# Patient Record
Sex: Male | Born: 1980 | Race: White | Hispanic: No | Marital: Married | Smoking: Light tobacco smoker
Health system: Southern US, Community
[De-identification: ages and names within clinical notes are randomized; demographics above are authoritative.]

## PROBLEM LIST (undated history)

## (undated) DIAGNOSIS — N201 Calculus of ureter: Secondary | ICD-10-CM

## (undated) HISTORY — PX: TONSILLECTOMY: SUR1361

---

## 2013-07-05 ENCOUNTER — Encounter (HOSPITAL_COMMUNITY): Payer: Self-pay | Admitting: Emergency Medicine

## 2013-07-05 ENCOUNTER — Emergency Department (HOSPITAL_COMMUNITY)
Admission: EM | Admit: 2013-07-05 | Discharge: 2013-07-05 | Disposition: A | Discharge status: Home / Self Care | Payer: BC Managed Care – PPO | Attending: Emergency Medicine | Admitting: Emergency Medicine

## 2013-07-05 ENCOUNTER — Emergency Department (HOSPITAL_COMMUNITY): Payer: BC Managed Care – PPO

## 2013-07-05 DIAGNOSIS — F17200 Nicotine dependence, unspecified, uncomplicated: Secondary | ICD-10-CM | POA: Diagnosis not present

## 2013-07-05 DIAGNOSIS — N201 Calculus of ureter: Secondary | ICD-10-CM

## 2013-07-05 DIAGNOSIS — R109 Unspecified abdominal pain: Secondary | ICD-10-CM | POA: Diagnosis present

## 2013-07-05 DIAGNOSIS — Z79899 Other long term (current) drug therapy: Secondary | ICD-10-CM | POA: Diagnosis not present

## 2013-07-05 DIAGNOSIS — R110 Nausea: Secondary | ICD-10-CM | POA: Insufficient documentation

## 2013-07-05 LAB — CBC WITH DIFFERENTIAL/PLATELET
Basophils Absolute: 0 10*3/uL (ref 0.0–0.1)
Basophils Relative: 1 % (ref 0–1)
EOS PCT: 2 % (ref 0–5)
Eosinophils Absolute: 0.1 10*3/uL (ref 0.0–0.7)
HEMATOCRIT: 43.7 % (ref 39.0–52.0)
HEMOGLOBIN: 14.8 g/dL (ref 13.0–17.0)
LYMPHS PCT: 19 % (ref 12–46)
Lymphs Abs: 1.4 10*3/uL (ref 0.7–4.0)
MCH: 30.9 pg (ref 26.0–34.0)
MCHC: 33.9 g/dL (ref 30.0–36.0)
MCV: 91.2 fL (ref 78.0–100.0)
MONO ABS: 0.6 10*3/uL (ref 0.1–1.0)
MONOS PCT: 8 % (ref 3–12)
NEUTROS ABS: 5.4 10*3/uL (ref 1.7–7.7)
Neutrophils Relative %: 70 % (ref 43–77)
Platelets: 247 10*3/uL (ref 150–400)
RBC: 4.79 MIL/uL (ref 4.22–5.81)
RDW: 12.2 % (ref 11.5–15.5)
WBC: 7.6 10*3/uL (ref 4.0–10.5)

## 2013-07-05 LAB — I-STAT CHEM 8, ED
BUN: 14 mg/dL (ref 6–23)
CALCIUM ION: 1.25 mmol/L — AB (ref 1.12–1.23)
CHLORIDE: 102 meq/L (ref 96–112)
Creatinine, Ser: 1 mg/dL (ref 0.50–1.35)
GLUCOSE: 90 mg/dL (ref 70–99)
HCT: 47 % (ref 39.0–52.0)
Hemoglobin: 16 g/dL (ref 13.0–17.0)
Potassium: 4.2 mEq/L (ref 3.7–5.3)
Sodium: 142 mEq/L (ref 137–147)
TCO2: 26 mmol/L (ref 0–100)

## 2013-07-05 LAB — URINALYSIS, ROUTINE W REFLEX MICROSCOPIC
BILIRUBIN URINE: NEGATIVE
GLUCOSE, UA: NEGATIVE mg/dL
KETONES UR: NEGATIVE mg/dL
Leukocytes, UA: NEGATIVE
Nitrite: NEGATIVE
PH: 6.5 (ref 5.0–8.0)
Protein, ur: NEGATIVE mg/dL
Specific Gravity, Urine: 1.013 (ref 1.005–1.030)
Urobilinogen, UA: 0.2 mg/dL (ref 0.0–1.0)

## 2013-07-05 LAB — URINE MICROSCOPIC-ADD ON

## 2013-07-05 MED ORDER — OXYCODONE-ACETAMINOPHEN 5-325 MG PO TABS: 1.0000 | ORAL_TABLET | Freq: Four times a day (QID) | ORAL | Status: DC | PRN

## 2013-07-05 MED ORDER — OXYCODONE-ACETAMINOPHEN 5-325 MG PO TABS
1.0000 | ORAL_TABLET | Freq: Once | ORAL | Status: AC
  Administered 2013-07-05: 1 via ORAL
  Filled 2013-07-05: qty 1

## 2013-07-05 MED ORDER — TAMSULOSIN HCL 0.4 MG PO CAPS: 0.4000 mg | ORAL_CAPSULE | Freq: Every day | ORAL | Status: DC

## 2013-07-05 MED ORDER — ONDANSETRON 8 MG PO TBDP: 8.0000 mg | ORAL_TABLET | Freq: Three times a day (TID) | ORAL | Status: DC | PRN

## 2013-07-05 MED ORDER — HYDROMORPHONE HCL PF 1 MG/ML IJ SOLN
1.0000 mg | Freq: Once | INTRAMUSCULAR | Status: AC
  Administered 2013-07-05: 1 mg via INTRAVENOUS
  Filled 2013-07-05: qty 1

## 2013-07-05 MED ORDER — KETOROLAC TROMETHAMINE 30 MG/ML IJ SOLN
30.0000 mg | Freq: Once | INTRAMUSCULAR | Status: AC
  Administered 2013-07-05: 30 mg via INTRAVENOUS
  Filled 2013-07-05: qty 1

## 2013-07-05 MED ORDER — ONDANSETRON HCL 4 MG/2ML IJ SOLN
4.0000 mg | Freq: Once | INTRAMUSCULAR | Status: AC
  Administered 2013-07-05: 4 mg via INTRAVENOUS
  Filled 2013-07-05: qty 2

## 2013-07-05 NOTE — ED Notes (Signed)
Per pt, sudden onset left flank pain at 8:30 am.  No hx of stones.  Pain radiates to groin.  No change in urination.

## 2013-07-05 NOTE — Discharge Instructions (Signed)

## 2013-07-05 NOTE — ED Provider Notes (Signed)
CSN: [id number]     Arrival date & time 07/05/13  0941 History   First MD Initiated Contact with Patient 07/05/13 1008     Chief Complaint  Patient presents with  . Flank Pain     (Consider location/radiation/quality/duration/timing/severity/associated sxs/prior Treatment) Patient is a 33 y.o. male presenting with flank pain. The history is provided by the patient.  Flank Pain This is a new problem. Pertinent negatives include no chest pain, no abdominal pain, no headaches and no shortness of breath.   patient developed left flank pain this morning. It starts in his left back that goes down into his testicle somewhat. No dysuria. No fevers. The pain is crampy. No additions help it or hurt it. He's had some mild nausea without vomiting. No previous history kidney stones. Patient is severe.  History reviewed. No pertinent past medical history. History reviewed. No pertinent past surgical history. History reviewed. No pertinent family history. History  Substance Use Topics  . Smoking status: Current Some Day Smoker  . Smokeless tobacco: Not on file  . Alcohol Use: Yes     Comment: daily    Review of Systems  Constitutional: Negative for activity change and appetite change.  Eyes: Negative for pain.  Respiratory: Negative for chest tightness and shortness of breath.   Cardiovascular: Negative for chest pain and leg swelling.  Gastrointestinal: Positive for nausea. Negative for vomiting, abdominal pain and diarrhea.  Genitourinary: Positive for flank pain. Negative for hematuria.  Musculoskeletal: Negative for back pain and neck stiffness.  Skin: Negative for rash.  Neurological: Negative for weakness, numbness and headaches.  Psychiatric/Behavioral: Negative for behavioral problems.      Allergies  Review of patient's allergies indicates no known allergies.  Home Medications   Prior to Admission medications   Medication Sig Start Date End Date Taking? Authorizing Provider   ibuprofen (ADVIL,MOTRIN) 200 MG tablet Take 400 mg by mouth every 6 (six) hours as needed for moderate pain.   Yes Historical Provider, MD  ondansetron (ZOFRAN-ODT) 8 MG disintegrating tablet Take 1 tablet (8 mg total) by mouth every 8 (eight) hours as needed for nausea or vomiting. 07/05/13   Juliet RudeNathan R. Pickering, MD  oxyCODONE-acetaminophen (PERCOCET/ROXICET) 5-325 MG per tablet Take 1-2 tablets by mouth every 6 (six) hours as needed for severe pain. 07/05/13   Juliet RudeNathan R. Pickering, MD  tamsulosin (FLOMAX) 0.4 MG CAPS capsule Take 1 capsule (0.4 mg total) by mouth daily. 07/05/13   Juliet RudeNathan R. Pickering, MD   BP 124/83  Pulse 51  Temp(Src) 97.8 F (36.6 C) (Oral)  Resp 16  SpO2 99% Physical Exam  Nursing note and vitals reviewed. Constitutional: He is oriented to person, place, and time. He appears well-developed and well-nourished.  Patient appears uncomfortable  HENT:  Head: Normocephalic and atraumatic.  Cardiovascular: Normal rate, regular rhythm and normal heart sounds.   No murmur heard. Pulmonary/Chest: Effort normal and breath sounds normal.  Abdominal: Soft. Bowel sounds are normal. He exhibits no distension and no mass. There is no tenderness. There is no rebound and no guarding.  Genitourinary:  CVA tenderness on left. No testicular tenderness mass or hernia  Musculoskeletal: Normal range of motion. He exhibits no edema.  Neurological: He is alert and oriented to person, place, and time. No cranial nerve deficit.  Skin: Skin is warm and dry.    ED Course  Procedures (including critical care time) Labs Review Labs Reviewed  URINALYSIS, ROUTINE W REFLEX MICROSCOPIC - Abnormal; Notable for the following:  Hgb urine dipstick LARGE (*)    All other components within normal limits  URINE MICROSCOPIC-ADD ON - Abnormal; Notable for the following:    Bacteria, UA FEW (*)    All other components within normal limits  I-STAT CHEM 8, ED - Abnormal; Notable for the following:     Calcium, Ion 1.25 (*)    All other components within normal limits  CBC WITH DIFFERENTIAL    Imaging Review Ct Abdomen Pelvis Wo Contrast  07/05/2013   CLINICAL DATA:  Left flank pain  EXAM: CT ABDOMEN AND PELVIS WITHOUT CONTRAST  TECHNIQUE: Multidetector CT imaging of the abdomen and pelvis was performed following the standard protocol without IV contrast.  COMPARISON:  None.  FINDINGS: Lung bases are unremarkable. Small amount of fluid noted in distal esophagus probable from gastroesophageal reflux. Clinical correlation is necessary. Unenhanced liver shows no biliary ductal dilatation. No calcified gallstones are noted within gallbladder. Unenhanced pancreas, spleen and adrenal glands are unremarkable. There is mild left hydronephrosis. Minimal left hydroureter. There is mild left periureteral stranding. No renal calcifications are identified.  In axial image 79 there is 4 mm calcified obstructive calculus in distal left ureter about 2.5 cm from left UVJ. No calcified calculi are noted within urinary bladder. Prostate gland and seminal vesicles are unremarkable.  No small bowel obstruction. No pericecal inflammation. Normal appendix. No ascites or free air. No adenopathy. No inguinal adenopathy. No destructive bony lesions are noted within pelvis.  Sagittal images of the spine are unremarkable. No destructive bony lesions are noted.  IMPRESSION: 1. There is mild left hydronephrosis and minimal left hydroureter. Mild left periureteral stranding. 2. There is 4 mm calcified obstructive calculus in distal left ureter about 2.5 cm from left UVJ. 3. Normal appendix.  No pericecal inflammation. 4. No calcified calculi are noted within urinary bladder. 5. No small bowel obstruction.   Electronically Signed   By: Natasha MeadLiviu  Pop M.D.   On: 07/05/2013 11:16     EKG Interpretation None      MDM   Final diagnoses:  Left ureteral stone    Patient with flank pain. He has a ureteral stone. Pain somewhat improved.  No UTI. Will discharge home with antibiotics Flomax and urology followup    Juliet Rudeathan R. Rubin PayorPickering, MD 07/05/13 1601

## 2013-07-05 NOTE — ED Notes (Signed)
Initial Contact - pt c/o 10/10 pain to L flank, denies hx of similar.  Pt denies nausea or other complaints at this time.  Pt appears uncomfortable.  Dr. Rubin PayorPickering aware.  Skin PWD.  A+Ox4.  MAEI, self repositioning for comfort.  NAD.

## 2013-07-07 ENCOUNTER — Ambulatory Visit (HOSPITAL_BASED_OUTPATIENT_CLINIC_OR_DEPARTMENT_OTHER): Admit: 2013-07-07 | Payer: Self-pay | Admitting: Urology

## 2013-07-07 ENCOUNTER — Encounter (HOSPITAL_BASED_OUTPATIENT_CLINIC_OR_DEPARTMENT_OTHER)
Admission: EM | Disposition: A | Discharge status: Home / Self Care | Payer: Self-pay | Source: Ambulatory Visit | Attending: Urology

## 2013-07-07 ENCOUNTER — Encounter (HOSPITAL_BASED_OUTPATIENT_CLINIC_OR_DEPARTMENT_OTHER): Payer: Self-pay | Admitting: *Deleted

## 2013-07-07 ENCOUNTER — Encounter (HOSPITAL_BASED_OUTPATIENT_CLINIC_OR_DEPARTMENT_OTHER): Payer: BC Managed Care – PPO | Admitting: Anesthesiology

## 2013-07-07 ENCOUNTER — Ambulatory Visit (HOSPITAL_BASED_OUTPATIENT_CLINIC_OR_DEPARTMENT_OTHER): Payer: BC Managed Care – PPO | Admitting: Anesthesiology

## 2013-07-07 ENCOUNTER — Ambulatory Visit (HOSPITAL_BASED_OUTPATIENT_CLINIC_OR_DEPARTMENT_OTHER)
Admission: EM | Admit: 2013-07-07 | Discharge: 2013-07-07 | Disposition: A | Discharge status: Home / Self Care | Payer: BC Managed Care – PPO | Source: Ambulatory Visit | Attending: Urology | Admitting: Urology

## 2013-07-07 ENCOUNTER — Encounter (HOSPITAL_COMMUNITY): Payer: Self-pay | Admitting: Emergency Medicine

## 2013-07-07 ENCOUNTER — Emergency Department (HOSPITAL_COMMUNITY)
Admission: EM | Admit: 2013-07-07 | Discharge: 2013-07-07 | Disposition: A | Discharge status: Home / Self Care | Payer: BC Managed Care – PPO | Source: Home / Self Care | Attending: Emergency Medicine | Admitting: Emergency Medicine

## 2013-07-07 ENCOUNTER — Other Ambulatory Visit: Payer: Self-pay | Admitting: Urology

## 2013-07-07 ENCOUNTER — Emergency Department (HOSPITAL_COMMUNITY): Payer: BC Managed Care – PPO

## 2013-07-07 DIAGNOSIS — F17200 Nicotine dependence, unspecified, uncomplicated: Secondary | ICD-10-CM | POA: Insufficient documentation

## 2013-07-07 DIAGNOSIS — Z79899 Other long term (current) drug therapy: Secondary | ICD-10-CM | POA: Insufficient documentation

## 2013-07-07 DIAGNOSIS — Z792 Long term (current) use of antibiotics: Secondary | ICD-10-CM

## 2013-07-07 DIAGNOSIS — Z9889 Other specified postprocedural states: Secondary | ICD-10-CM

## 2013-07-07 DIAGNOSIS — N201 Calculus of ureter: Secondary | ICD-10-CM | POA: Insufficient documentation

## 2013-07-07 DIAGNOSIS — Z87442 Personal history of urinary calculi: Secondary | ICD-10-CM

## 2013-07-07 DIAGNOSIS — N23 Unspecified renal colic: Secondary | ICD-10-CM

## 2013-07-07 HISTORY — DX: Calculus of ureter: N20.1

## 2013-07-07 HISTORY — PX: CYSTOSCOPY WITH STENT PLACEMENT: SHX5790

## 2013-07-07 HISTORY — PX: HOLMIUM LASER APPLICATION: SHX5852

## 2013-07-07 HISTORY — PX: CYSTOSCOPY/RETROGRADE/URETEROSCOPY: SHX5316

## 2013-07-07 LAB — URINALYSIS, ROUTINE W REFLEX MICROSCOPIC
BILIRUBIN URINE: NEGATIVE
Glucose, UA: NEGATIVE mg/dL
Ketones, ur: NEGATIVE mg/dL
LEUKOCYTES UA: NEGATIVE
Nitrite: NEGATIVE
Protein, ur: NEGATIVE mg/dL
SPECIFIC GRAVITY, URINE: 1.018 (ref 1.005–1.030)
UROBILINOGEN UA: 0.2 mg/dL (ref 0.0–1.0)
pH: 5.5 (ref 5.0–8.0)

## 2013-07-07 LAB — CBC
HCT: 38.3 % — ABNORMAL LOW (ref 39.0–52.0)
Hemoglobin: 13.2 g/dL (ref 13.0–17.0)
MCH: 31.4 pg (ref 26.0–34.0)
MCHC: 34.5 g/dL (ref 30.0–36.0)
MCV: 91 fL (ref 78.0–100.0)
PLATELETS: 213 10*3/uL (ref 150–400)
RBC: 4.21 MIL/uL — AB (ref 4.22–5.81)
RDW: 12.1 % (ref 11.5–15.5)
WBC: 14.1 10*3/uL — AB (ref 4.0–10.5)

## 2013-07-07 LAB — BASIC METABOLIC PANEL
BUN: 11 mg/dL (ref 6–23)
CALCIUM: 9 mg/dL (ref 8.4–10.5)
CO2: 22 meq/L (ref 19–32)
Chloride: 99 mEq/L (ref 96–112)
Creatinine, Ser: 1.16 mg/dL (ref 0.50–1.35)
GFR calc Af Amer: >90 mL/min (ref >90)
GFR calc non Af Amer: 81 mL/min — ABNORMAL LOW (ref >90)
Glucose, Bld: 103 mg/dL — ABNORMAL HIGH (ref 70–99)
Potassium: 3.8 mEq/L (ref 3.7–5.3)
SODIUM: 136 meq/L — AB (ref 137–147)

## 2013-07-07 LAB — URINE MICROSCOPIC-ADD ON

## 2013-07-07 SURGERY — CYSTO
Anesthesia: Choice | Laterality: Left

## 2013-07-07 SURGERY — CYSTOSCOPY/RETROGRADE/URETEROSCOPY
Anesthesia: General | Site: Ureter | Laterality: Left

## 2013-07-07 MED ORDER — MORPHINE SULFATE 4 MG/ML IJ SOLN
8.0000 mg | Freq: Once | INTRAMUSCULAR | Status: AC
  Administered 2013-07-07: 8 mg via INTRAVENOUS
  Filled 2013-07-07: qty 2

## 2013-07-07 MED ORDER — MORPHINE SULFATE 4 MG/ML IJ SOLN
8.0000 mg | Freq: Once | INTRAMUSCULAR | Status: AC
Start: 2013-07-07 — End: 2013-07-07
  Administered 2013-07-07: 8 mg via INTRAVENOUS
  Filled 2013-07-07: qty 2

## 2013-07-07 MED ORDER — ONDANSETRON HCL 4 MG/2ML IJ SOLN
4.0000 mg | Freq: Once | INTRAMUSCULAR | Status: AC
  Administered 2013-07-07: 4 mg via INTRAVENOUS
  Filled 2013-07-07: qty 2

## 2013-07-07 MED ORDER — IOHEXOL 350 MG/ML SOLN
INTRAVENOUS | Status: DC | PRN
  Administered 2013-07-07: 10 mL

## 2013-07-07 MED ORDER — SODIUM CHLORIDE 0.9 % IV BOLUS (SEPSIS)
1000.0000 mL | Freq: Once | INTRAVENOUS | Status: AC
  Administered 2013-07-07: 1000 mL via INTRAVENOUS

## 2013-07-07 MED ORDER — OXYCODONE-ACETAMINOPHEN 5-325 MG PO TABS
ORAL_TABLET | ORAL | Status: AC
  Filled 2013-07-07: qty 1

## 2013-07-07 MED ORDER — FENTANYL CITRATE 0.05 MG/ML IJ SOLN
INTRAMUSCULAR | Status: AC
  Filled 2013-07-07: qty 2

## 2013-07-07 MED ORDER — CEFAZOLIN SODIUM-DEXTROSE 2-3 GM-% IV SOLR
2.0000 g | INTRAVENOUS | Status: AC
  Administered 2013-07-07: 2 g via INTRAVENOUS
  Filled 2013-07-07: qty 50

## 2013-07-07 MED ORDER — FENTANYL CITRATE 0.05 MG/ML IJ SOLN
50.0000 ug | Freq: Once | INTRAMUSCULAR | Status: AC
  Administered 2013-07-07: 50 ug via INTRAVENOUS
  Filled 2013-07-07: qty 2

## 2013-07-07 MED ORDER — FENTANYL CITRATE 0.05 MG/ML IJ SOLN
50.0000 ug | Freq: Two times a day (BID) | INTRAMUSCULAR | Status: DC | PRN
  Administered 2013-07-07 (×2): 50 ug via INTRAVENOUS
  Filled 2013-07-07: qty 1

## 2013-07-07 MED ORDER — FENTANYL CITRATE 0.05 MG/ML IJ SOLN
INTRAMUSCULAR | Status: DC | PRN
  Administered 2013-07-07: 50 ug via INTRAVENOUS
  Administered 2013-07-07 (×2): 25 ug via INTRAVENOUS

## 2013-07-07 MED ORDER — CEFAZOLIN SODIUM 1-5 GM-% IV SOLN
1.0000 g | INTRAVENOUS | Status: DC
  Filled 2013-07-07: qty 50

## 2013-07-07 MED ORDER — PROMETHAZINE HCL 25 MG/ML IJ SOLN
6.2500 mg | INTRAMUSCULAR | Status: DC | PRN
  Filled 2013-07-07: qty 1

## 2013-07-07 MED ORDER — ONDANSETRON HCL 4 MG/2ML IJ SOLN
INTRAMUSCULAR | Status: DC | PRN
  Administered 2013-07-07: 4 mg via INTRAVENOUS

## 2013-07-07 MED ORDER — FENTANYL CITRATE 0.05 MG/ML IJ SOLN
INTRAMUSCULAR | Status: AC
  Filled 2013-07-07: qty 4

## 2013-07-07 MED ORDER — HYDROMORPHONE HCL PF 1 MG/ML IJ SOLN
1.0000 mg | Freq: Once | INTRAMUSCULAR | Status: AC
  Administered 2013-07-07: 1 mg via INTRAVENOUS
  Filled 2013-07-07: qty 1

## 2013-07-07 MED ORDER — ACETAMINOPHEN 10 MG/ML IV SOLN
INTRAVENOUS | Status: DC | PRN
  Administered 2013-07-07: 1000 mg via INTRAVENOUS

## 2013-07-07 MED ORDER — LACTATED RINGERS IV SOLN
INTRAVENOUS | Status: DC
  Administered 2013-07-07 (×2): via INTRAVENOUS
  Filled 2013-07-07: qty 1000

## 2013-07-07 MED ORDER — OXYCODONE-ACETAMINOPHEN 5-325 MG PO TABS
1.0000 | ORAL_TABLET | Freq: Four times a day (QID) | ORAL | Status: DC | PRN
  Administered 2013-07-07: 1 via ORAL
  Filled 2013-07-07: qty 2

## 2013-07-07 MED ORDER — FENTANYL CITRATE 0.05 MG/ML IJ SOLN
25.0000 ug | INTRAMUSCULAR | Status: DC | PRN
  Administered 2013-07-07 (×2): 50 ug via INTRAVENOUS
  Filled 2013-07-07: qty 1

## 2013-07-07 MED ORDER — KETOROLAC TROMETHAMINE 30 MG/ML IJ SOLN
INTRAMUSCULAR | Status: DC | PRN
  Administered 2013-07-07: 30 mg via INTRAVENOUS

## 2013-07-07 MED ORDER — DEXAMETHASONE SODIUM PHOSPHATE 4 MG/ML IJ SOLN
INTRAMUSCULAR | Status: DC | PRN
  Administered 2013-07-07: 10 mg via INTRAVENOUS

## 2013-07-07 MED ORDER — METOCLOPRAMIDE HCL 5 MG/ML IJ SOLN
INTRAMUSCULAR | Status: DC | PRN
  Administered 2013-07-07: 10 mg via INTRAVENOUS

## 2013-07-07 MED ORDER — PROPOFOL 10 MG/ML IV BOLUS
INTRAVENOUS | Status: DC | PRN
  Administered 2013-07-07: 180 mg via INTRAVENOUS

## 2013-07-07 MED ORDER — MIDAZOLAM HCL 2 MG/2ML IJ SOLN
INTRAMUSCULAR | Status: AC
  Filled 2013-07-07: qty 2

## 2013-07-07 MED ORDER — SUCCINYLCHOLINE CHLORIDE 20 MG/ML IJ SOLN
INTRAMUSCULAR | Status: DC | PRN
  Administered 2013-07-07: 100 mg via INTRAVENOUS

## 2013-07-07 MED ORDER — LIDOCAINE HCL (CARDIAC) 20 MG/ML IV SOLN
INTRAVENOUS | Status: DC | PRN
  Administered 2013-07-07: 60 mg via INTRAVENOUS

## 2013-07-07 MED ORDER — DIPHENHYDRAMINE HCL 50 MG/ML IJ SOLN
25.0000 mg | Freq: Once | INTRAMUSCULAR | Status: AC
  Administered 2013-07-07: 25 mg via INTRAVENOUS
  Filled 2013-07-07: qty 1

## 2013-07-07 MED ORDER — SODIUM CHLORIDE 0.9 % IR SOLN
Status: DC | PRN
  Administered 2013-07-07: 4000 mL

## 2013-07-07 SURGICAL SUPPLY — 26 items
BAG DRAIN URO-CYSTO SKYTR STRL (DRAIN) ×4 IMPLANT
CANISTER SUCT LVC 12 LTR MEDI- (MISCELLANEOUS) ×4 IMPLANT
CATH CLEAR GEL 3F BACKSTOP (CATHETERS) ×4 IMPLANT
CLOTH BEACON ORANGE TIMEOUT ST (SAFETY) ×4 IMPLANT
CONTOUR STENT ×4 IMPLANT
DRAPE CAMERA CLOSED 9X96 (DRAPES) ×4 IMPLANT
ELECT REM PT RETURN 9FT ADLT (ELECTROSURGICAL) ×4
ELECTRODE REM PT RTRN 9FT ADLT (ELECTROSURGICAL) ×2 IMPLANT
FIBER LASER FLEXIVA 365 (UROLOGICAL SUPPLIES) ×4 IMPLANT
GLOVE BIO SURGEON STRL SZ7 (GLOVE) ×4 IMPLANT
GLOVE BIOGEL M 6.5 STRL (GLOVE) ×4 IMPLANT
GLOVE BIOGEL PI IND STRL 6.5 (GLOVE) ×4 IMPLANT
GLOVE BIOGEL PI INDICATOR 6.5 (GLOVE) ×4
GOWN STRL REUS W/TWL LRG LVL3 (GOWN DISPOSABLE) ×8 IMPLANT
IV NS 1000ML (IV SOLUTION) ×2
IV NS 1000ML BAXH (IV SOLUTION) ×2 IMPLANT
IV NS IRRIG 3000ML ARTHROMATIC (IV SOLUTION) ×4 IMPLANT
NDL SAFETY ECLIPSE 18X1.5 (NEEDLE) IMPLANT
NEEDLE HYPO 18GX1.5 SHARP (NEEDLE)
NEEDLE HYPO 22GX1.5 SAFETY (NEEDLE) IMPLANT
NS IRRIG 500ML POUR BTL (IV SOLUTION) IMPLANT
PACK CYSTOSCOPY (CUSTOM PROCEDURE TRAY) ×4 IMPLANT
SHEATH URET ACCESS 12FR/35CM (UROLOGICAL SUPPLIES) ×4 IMPLANT
STENT URET 6FRX24 CONTOUR (STENTS) ×4 IMPLANT
SYR 20CC LL (SYRINGE) IMPLANT
WATER STERILE IRR 3000ML UROMA (IV SOLUTION) IMPLANT

## 2013-07-07 NOTE — ED Notes (Signed)
Spoke with urologist- patient is to be discharged with PIV to surgical center and then will be admitted back to hospital per urologist. Family and patient instructed not to touch PIV. [facility] and [facility] is aware patient is coming with IV. Patient and family instructed on directions to surgical center and procedure for admission into surgical center.

## 2013-07-07 NOTE — Anesthesia Procedure Notes (Signed)
Procedure Name: Intubation Date/Time: 07/07/2013 1:47 PM Performed by: Norva PavlovALLAWAY, ROBIN G Pre-anesthesia Checklist: Patient identified, Emergency Drugs available, Suction available and Patient being monitored Patient Re-evaluated:Patient Re-evaluated prior to inductionOxygen Delivery Method: Circle System Utilized Preoxygenation: Pre-oxygenation with 100% oxygen Intubation Type: IV induction, Rapid sequence and Cricoid Pressure applied Tube type: Oral Tube size: 8.0 mm Number of attempts: 1 Airway Equipment and Method: stylet and oral airway Placement Confirmation: ETT inserted through vocal cords under direct vision,  positive ETCO2 and breath sounds checked- equal and bilateral Secured at: 22 cm Tube secured with: Tape Dental Injury: Teeth and Oropharynx as per pre-operative assessment

## 2013-07-07 NOTE — Discharge Instructions (Signed)
Take pain meds as prescribed by Dr. Harriet PhoNesse.   Go to surgical center for procedure right now.   Return to ER if you have severe pain, vomiting, fevers, unable to urinate.

## 2013-07-07 NOTE — Transfer of Care (Signed)
Immediate Anesthesia Transfer of Care Note  Patient: Ophelia Charteraul Decoteau  Procedure(s) Performed: Procedure(s) (LRB): CYSTOSCOPY/RETROGRADE/URETEROSCOPY WITH STONE EXTRACTION (Left) CYSTOSCOPY WITH STENT PLACEMENT (Left) HOLMIUM LASER APPLICATION (Left)  Patient Location: PACU  Anesthesia Type: General  Level of Consciousness: awake, alert  and oriented  Airway & Oxygen Therapy: Patient Spontanous Breathing and Patient connected to face mask oxygen  Post-op Assessment: Report given to PACU RN and Post -op Vital signs reviewed and stable  Post vital signs: Reviewed and stable  Complications: No apparent anesthesia complications

## 2013-07-07 NOTE — Consult Note (Signed)
Urology Consult  Referring physician: Dr. Alfonzo Beers Reason for referral: Left ureteral stone  Chief Complaint: Severe left flank pain  History of Present Illness: Patient is a 33 years old male who was seen in the emergency room 2 days ago with severe left flank pain. The pain was sudden in onset and radiating to the left lower quadrant. He had dry heaves. He was sent home on oral analgesics. He did well for 2 days  until early this morning when he woke up with severe left flank pain. He returned to the emergency room. He was given IV analgesics; however the pain has persisted. The left distal ureteral calculus is not well identified on today's KUB. Patient continues to have pain. I explained to him and his wife that the treatment options are: continued medical expulsive therapy versus stone manipulation. They understand that there is a possibility that he may have already passed the stone and that I would not be able to find the stone in the ureter. Since he is still in pain he would like to proceed with stone manipulation. History reviewed. No pertinent past medical history. History reviewed. No pertinent past surgical history.  Medications: Percocet, Flomax. Allergies: No Known Allergies  History reviewed. No pertinent family history. Social History:  reports that he has been smoking Cigarettes.  He has been smoking about 0.00 packs per day. He does not have any smokeless tobacco history on file. He reports that he drinks alcohol. He reports that he uses illicit drugs (Marijuana).  ROS: All systems are reviewed and negative except as noted.   Physical Exam:  Vital signs in last 24 hours: Temp:  [97.5 F (36.4 C)-97.8 F (36.6 C)] 97.8 F (36.6 C) (06/25 0921) Pulse Rate:  [76-90] 80 (06/25 0918) Resp:  [16-24] 16 (06/25 0602) BP: (143-163)/(77-103) 146/77 mmHg (06/25 0918) SpO2:  [88 %-98 %] 94 % (06/25 0918) FiO2 (%):  [2.5 %] 2.5 % (06/25 0747) HEENT: Normal Cardiovascular:  Skin warm; not flushed Respiratory: Breaths quiet; no shortness of breath Abdomen: No masses Neurological: Normal sensation to touch Musculoskeletal: Normal motor function arms and legs Lymphatics: No inguinal adenopathy Skin: No rashes Genitourinary: Penis and scrotal contents are within normal limits.  Laboratory Data:  Results for orders placed during the hospital encounter of 07/07/13 (from the past 72 hour(s))  CBC     Status: Abnormal   Collection Time    07/07/13  4:48 AM      Result Value Ref Range   WBC 14.1 (*) 4.0 - 10.5 K/uL   RBC 4.21 (*) 4.22 - 5.81 MIL/uL   Hemoglobin 13.2  13.0 - 17.0 g/dL   Comment: DELTA CHECK NOTED     REPEATED TO VERIFY     PREVIOUS RESULT ISTAT   HCT 38.3 (*) 39.0 - 52.0 %   MCV 91.0  78.0 - 100.0 fL   MCH 31.4  26.0 - 34.0 pg   MCHC 34.5  30.0 - 36.0 g/dL   RDW 12.1  11.5 - 15.5 %   Platelets 213  150 - 400 K/uL  BASIC METABOLIC PANEL     Status: Abnormal   Collection Time    07/07/13  4:48 AM      Result Value Ref Range   Sodium 136 (*) 137 - 147 mEq/L   Potassium 3.8  3.7 - 5.3 mEq/L   Chloride 99  96 - 112 mEq/L   CO2 22  19 - 32 mEq/L   Glucose, Bld 103 (*)  70 - 99 mg/dL   BUN 11  6 - 23 mg/dL   Creatinine, Ser 1.16  0.50 - 1.35 mg/dL   Calcium 9.0  8.4 - 10.5 mg/dL   GFR calc non Af Amer 81 (*) >90 mL/min   GFR calc Af Amer >90  >90 mL/min   Comment: (NOTE)     The eGFR has been calculated using the CKD EPI equation.     This calculation has not been validated in all clinical situations.     eGFR's persistently <90 mL/min signify possible Chronic Kidney     Disease.  URINALYSIS, ROUTINE W REFLEX MICROSCOPIC     Status: Abnormal   Collection Time    07/07/13  5:54 AM      Result Value Ref Range   Color, Urine YELLOW  YELLOW   APPearance CLEAR  CLEAR   Specific Gravity, Urine 1.018  1.005 - 1.030   pH 5.5  5.0 - 8.0   Glucose, UA NEGATIVE  NEGATIVE mg/dL   Hgb urine dipstick LARGE (*) NEGATIVE   Bilirubin Urine NEGATIVE   NEGATIVE   Ketones, ur NEGATIVE  NEGATIVE mg/dL   Protein, ur NEGATIVE  NEGATIVE mg/dL   Urobilinogen, UA 0.2  0.0 - 1.0 mg/dL   Nitrite NEGATIVE  NEGATIVE   Leukocytes, UA NEGATIVE  NEGATIVE  URINE MICROSCOPIC-ADD ON     Status: None   Collection Time    07/07/13  5:54 AM      Result Value Ref Range   WBC, UA 0-2  <3 WBC/hpf   RBC / HPF 11-20  <3 RBC/hpf   Urine-Other MUCOUS PRESENT     No results found for this or any previous visit (from the past 240 hour(s)). Creatinine:  Recent Labs  07/05/13 1048 07/07/13 0448  CREATININE 1.00 1.16    Xrays: See report/chart   Impression/Assessment:  Left distal ureteral calculus. Severe left flank pain. Plan:  Cystoscopy, left retrograde pyelogram, ureteroscopy, holmium laser of ureteral calculus, stone manipulation, double-J stent. The procedure, the risks, benefits were explained to the patient and his wife. The risks include but are not limited to hemorrhage, infection, ureteral injury, inability to find or extract the stone. They understand and are agreeable  Arvil Persons 07/07/2013, 10:03 AM    CC: Dr Alfonzo Beers

## 2013-07-07 NOTE — Discharge Instructions (Signed)
Post Anesthesia Home Care Instructions  Activity: Get plenty of rest for the remainder of the day. A responsible adult should stay with you for 24 hours following the procedure.  For the next 24 hours, DO NOT: -Drive a car -Operate machinery -Drink alcoholic beverages -Take any medication unless instructed by your physician -Make any legal decisions or sign important papers.  Meals: Start with liquid foods such as gelatin or soup. Progress to regular foods as tolerated. Avoid greasy, spicy, heavy foods. If nausea and/or vomiting occur, drink only clear liquids until the nausea and/or vomiting subsides. Call your physician if vomiting continues.  Special Instructions/Symptoms: Your throat may feel dry or sore from the anesthesia or the breathing tube placed in your throat during surgery. If this causes discomfort, gargle with warm salt water. The discomfort should disappear within 24 hours.      [facility] [phone] Post Ureteroscopy With or Without Stent Instructions  Definitions:  Ureter: The duct that transports urine from the kidney to the bladder. Stent:   A plastic hollow tube that is placed into the ureter, from the kidney to the bladder to prevent the ureter from swelling shut.  GENERAL INSTRUCTIONS:  Despite the fact that no skin incisions were used, the area around the ureter and bladder is raw and irritated. The stent is a foreign body which will further irritate the bladder wall. This irritation is manifested by increased frequency of urination, both day and night, and by an increase in the urge to urinate. In some, the urge to urinate is present almost always. Sometimes the urge is strong enough that you may not be able to stop yourself from urinating. The only real cure is to remove the stent and then give time for the bladder wall to heal which can't be done until the danger of the ureter swelling shut has passed, which varies.  You may see some  blood in your urine while the stent is in place and a few days afterwards. Do not be alarmed, even if the urine was clear for a while. Get off your feet and drink lots of fluids until clearing occurs. If you start to pass clots or don't improve, call us.  DIET: You may return to your normal diet immediately. Because of the raw surface of your bladder, alcohol, spicy foods, acid type foods and drinks with caffeine may cause irritation or frequency and should be used in moderation. To keep your urine flowing freely and to avoid constipation, drink plenty of fluids during the day ( 8-10 glasses ). Tip: Avoid cranberry juice because it is very acidic.  ACTIVITY: Your physical activity doesn't need to be restricted. However, if you are very active, you may see some blood in your urine. We suggest that you reduce your activity under these circumstances until the bleeding has stopped.  BOWELS: It is important to keep your bowels regular during the postoperative period. Straining with bowel movements can cause bleeding. A bowel movement every other day is reasonable. Use a mild laxative if needed, such as Milk of Magnesia 2-3 tablespoons, or 2 Dulcolax tablets. Call if you continue to have problems. If you have been taking narcotics for pain, before, during or after your surgery, you may be constipated. Take a laxative if necessary.   MEDICATION: You should resume your pre-surgery medications unless told not to. In addition you will often be given an antibiotic to prevent infection. These should be taken as prescribed until the bottles are finished unless   you are having an unusual reaction to one of the drugs.  PROBLEMS YOU SHOULD REPORT TO US: Fevers over 100.5 Fahrenheit. Heavy bleeding, or clots ( See above notes about blood in urine ). Inability to urinate. Drug reactions ( hives, rash, nausea, vomiting, diarrhea ). Severe burning or pain with urination that is not improving.  FOLLOW-UP: You will  need a follow-up appointment to monitor your progress. Call for this appointment at the number listed above. Usually the first appointment will be about three to fourteen days after your surgery.      

## 2013-07-07 NOTE — Anesthesia Postprocedure Evaluation (Signed)
Anesthesia Post Note  Patient: Kyle Norton  Procedure(s) Performed: Procedure(s) (LRB): CYSTOSCOPY/RETROGRADE/URETEROSCOPY WITH STONE EXTRACTION (Left) CYSTOSCOPY WITH STENT PLACEMENT (Left) HOLMIUM LASER APPLICATION (Left)  Anesthesia type: General  Patient location: PACU  Post pain: Pain level controlled  Post assessment: Post-op Vital signs reviewed  Last Vitals: BP 116/59  Pulse 88  Temp(Src) 36.5 C (Oral)  Resp 18  SpO2 92%  Post vital signs: Reviewed  Level of consciousness: sedated  Complications: No apparent anesthesia complications

## 2013-07-07 NOTE — Anesthesia Preprocedure Evaluation (Signed)
Anesthesia Evaluation  Patient identified by MRN, date of birth, ID band Patient awake    Reviewed: Allergy & Precautions, H&P , NPO status , Patient's Chart, lab work & pertinent test results  Airway Mallampati: II TM Distance: >3 FB Neck ROM: Full    Dental no notable dental hx.    Pulmonary Current Smoker,  breath sounds clear to auscultation  Pulmonary exam normal       Cardiovascular Exercise Tolerance: Good negative cardio ROS  Rhythm:Regular Rate:Normal     Neuro/Psych negative neurological ROS  negative psych ROS   GI/Hepatic negative GI ROS, Neg liver ROS,   Endo/Other  negative endocrine ROS  Renal/GU negative Renal ROS  negative genitourinary   Musculoskeletal negative musculoskeletal ROS (+)   Abdominal   Peds negative pediatric ROS (+)  Hematology negative hematology ROS (+)   Anesthesia Other Findings   Reproductive/Obstetrics negative OB ROS                           Anesthesia Physical Anesthesia Plan  ASA: II and emergent  Anesthesia Plan: General   Post-op Pain Management:    Induction: Intravenous  Airway Management Planned: Oral ETT  Additional Equipment:   Intra-op Plan:   Post-operative Plan: Extubation in OR  Informed Consent: I have reviewed the patients History and Physical, chart, labs and discussed the procedure including the risks, benefits and alternatives for the proposed anesthesia with the patient or authorized representative who has indicated his/her understanding and acceptance.   Dental advisory given  Plan Discussed with: CRNA  Anesthesia Plan Comments:         Anesthesia Quick Evaluation

## 2013-07-07 NOTE — ED Notes (Signed)
Surgical consent at bedside, witnessed patient's signature. Need physician signature who will be performing surgical intervention.

## 2013-07-07 NOTE — ED Notes (Addendum)
Pt sent with NT to surgical center in wheelchair with PIV. Surgical center aware patient is coming and aware MD will have to put in orders for patient. Consulted with charge RN, Diplomatic Services operational officersecretary and AD. Consensus was to discharge patient. AVS in hand and consent sent with patient.

## 2013-07-07 NOTE — Op Note (Signed)
Ophelia Charteraul Castrillo is a 33 y.o.   07/07/2013  General  Pre-op diagnosis: Left distal ureteral calculus, left flank pain  Postop diagnosis: Same  Procedure done: Cystoscopy, left retrograde pyelogram, ureteroscopy, stone extraction, insertion of double-J stent  Surgeon: Wendie SimmerMarc H. Nesi  Anesthesia: General  Indication: Patient is a 33 years old male who was seen in the emergency room 2 days ago with sudden onset of severe left flank pain associated with dry heaves. CT scan showed a 4 mm stone in the left distal ureter. He was treated with analgesics and became pain-free. He was then discharged home on Percocet and Flomax.  Early this morning he started having severe pain again. He returned to the emergency room. He was given IV analgesics however the pain persisted. The stone could not be seen on KUB today. He is scheduled now for cystoscopy and stone manipulation  Procedure: Patient was identified by his wrist band and proper timeout was taken.  Under general anesthesia he was prepped and draped and placed in the dorsolithotomy position. A panendoscope was inserted in the bladder. The urethra is normal. The bladder mucosa is normal. There is no stone or tumor in the bladder. The ureteral orifices are in normal position and shape.  Left retrograde pyelogram:  A cone-tip catheter was passed through the cystoscope and the left ureteral orifice. Contrast was injected through the cone-tip catheter. There is a filling defect in the distal ureter consistent with the known ureteral stone. I did not inject the contrast under pressure and therefore the mid and proximal ureter were not visualized. The cone-tip catheter was removed. A sensor wire was passed and through the cystoscope and the left ureter.  The cystoscope was removed. A #6.5 French semirigid ureteroscope was then passed in the bladder and through the left ureteral orifice. The ureteroscope could not be advanced beyond the intramural ureter. I then  removed the ureteroscope and passed a ureteroscope access sheath over the sensor wire and dilated the intramural ureter. The ureteroscope was then reinserted in the bladder and passed in the distal ureter where the stone was identified. I then attempted to fragment the stone with a 365 microfiber holmium laser. But the stone migrated in the mid ureter. I then used the back stopper  to stop the migration of the stone more proximally. Then with a 0 tip Nitinol basket I passed the stone within the wires of the basket in an attempt to bring it distally. I was then able to remove the stone without difficulty out of the ureter. The stone was then extracted. The ureteroscope was then reinserted in the ureter. I injected contrast through the ureteroscope. There is no evidence of filling defect in the ureter. There is no extravasation of contrast. The mid and proximal ureter appear normal. The renal pelvis and calyces are normal. The ureteroscope was then removed.  The sensor wire was then backloaded into the cystoscope and a #6 [place]French last 24 double-J stent was passed over the sensor wire. The proximal curl of the double-J stent is in the renal pelvis. The distal curl is in the bladder. The double-J stent was left without a string.  The bladder was then emptied and the cystoscope and sensor wire removed.  The patient tolerated the procedure well and left the OR in satisfactory condition to postanesthesia care unit.

## 2013-07-07 NOTE — ED Notes (Signed)
Pt states he was here on Tuesday morning and was diagnosed with a kidney stone on the left side  Pt states yesterday he was ok and the pain would come and go  Pt states tonight the pain is worse  Pt states he has taken two percocet and cannot tell any difference with the pain  Pt denies nausea or vomiting tonight

## 2013-07-07 NOTE — ED Provider Notes (Signed)
  Physical Exam  BP 146/77  Pulse 80  Temp(Src) 97.8 F (36.6 C) (Oral)  Resp 16  SpO2 94%  Physical Exam  ED Course  Procedures  Care assumed at sign out from Dr. Karma GanjaLinker. Had 4 mm L sided stone several days ago here with worse flank pain. Requiring multiple round of pain meds. Dr. Harriet PhoNesse came to evaluate and will take him for stent placement.     Richardean Canalavid H Yao, MD 07/07/13 [phone]

## 2013-07-07 NOTE — ED Notes (Signed)
Full report including medications given to Wells FargoSusan RN

## 2013-07-07 NOTE — ED Notes (Signed)
Initial contact-pt A&Ox4. Family at bedside. Pt now rates pain at 4/10 and says it has "definitely improved." MD notified regarding pain level and itching post Dilaudid administration. No other complaints at this time.

## 2013-07-07 NOTE — ED Provider Notes (Addendum)
CSN: [id number]     Arrival date & time 07/07/13  [id number] History   First MD Initiated Contact with Patient 07/07/13 0424     Chief Complaint  Patient presents with  . Flank Pain     (Consider location/radiation/quality/duration/timing/severity/associated sxs/prior Treatment) HPI Pt presenting with c/o left sided flank pain. He was diagnosed with ureteral stone 2 days ago in the ED. He states pain had been fairly well controlled on percocet at home, states pain became much worse during the night tonight. Pain is sharp and constant.  He tried taking percocet but states this time he did not have any relief.  No fever/chills.  No vomiting.  Pain is not worse with movement, he has been having a difficult time finding a comfortable position.  Denies dysuria or blood in urine.  There are no other associated systemic symptoms, there are no other alleviating or modifying factors.   Past Medical History  Diagnosis Date  . Left ureteral calculus    Past Surgical History  Procedure Laterality Date  . Cystoscopy/retrograde/ureteroscopy Left 07/07/2013    Procedure: CYSTOSCOPY/RETROGRADE/URETEROSCOPY WITH STONE EXTRACTION;  Surgeon: Danae ChenMarc H Nesi, MD;  [facility] [place];  Service: Urology;  Laterality: Left;  . Cystoscopy with stent placement Left 07/07/2013    Procedure: CYSTOSCOPY WITH STENT PLACEMENT;  Surgeon: Danae ChenMarc H Nesi, MD;  [facility] [place];  Service: Urology;  Laterality: Left;  . Holmium laser application Left 07/07/2013    Procedure: HOLMIUM LASER APPLICATION;  Surgeon: Danae ChenMarc H Nesi, MD;  [facility] [place];  Service: Urology;  Laterality: Left;  . Tonsillectomy     Family History  Problem Relation Age of Onset  . Adopted: Yes   History  Substance Use Topics  . Smoking status: Light Tobacco Smoker    Types: Cigarettes  . Smokeless tobacco: Never Used  . Alcohol Use: Yes     Comment: daily    Review of Systems ROS reviewed and all  otherwise negative except for mentioned in HPI    Allergies  Dilaudid  Home Medications   Prior to Admission medications   Medication Sig Start Date End Date Taking? Authorizing Provider  ibuprofen (ADVIL,MOTRIN) 200 MG tablet Take 400 mg by mouth every 6 (six) hours as needed for moderate pain.   Yes Historical Provider, MD  ondansetron (ZOFRAN-ODT) 8 MG disintegrating tablet Take 1 tablet (8 mg total) by mouth every 8 (eight) hours as needed for nausea or vomiting. 07/05/13  Yes Juliet RudeNathan R. Pickering, MD  oxyCODONE-acetaminophen (PERCOCET/ROXICET) 5-325 MG per tablet Take 1-2 tablets by mouth every 6 (six) hours as needed for severe pain. 07/05/13  Yes Nathan R. Pickering, MD  tamsulosin (FLOMAX) 0.4 MG CAPS capsule Take 1 capsule (0.4 mg total) by mouth daily. 07/05/13  Yes Nathan R. Pickering, MD  cephALEXin (KEFLEX) 500 MG capsule Take 1 capsule (500 mg total) by mouth 3 (three) times daily. 07/09/13   Brandt LoosenJulie Manly, MD   BP 150/86  Pulse 85  Temp(Src) 97.8 F (36.6 C) (Oral)  Resp 19  SpO2 99% Vitals reviewed Physical Exam Physical Examination: General appearance - alert, uncomfortable appearing, and in no distress Mental status - alert, oriented to person, place, and time Eyes - no conjunctival injection, no scleral icterus Mouth - mucous membranes moist, pharynx normal without lesions Chest - clear to auscultation, no wheezes, rales or rhonchi, symmetric air entry Heart - normal rate, regular rhythm, normal S1, S2, no murmurs, rubs, clicks or gallops Abdomen -  soft, nontender, nondistended, no masses or organomegaly Back exam - left CVA tenderness Extremities - peripheral pulses normal, no pedal edema, no clubbing or cyanosis Skin - normal coloration and turgor, no rashes  ED Course  Procedures (including critical care time)  7:05 AM pt up and walking to the bathroom.  He is feeling much improved.    7:53 AM pt is again writhing in pain, he has had 2 doses of fentanyl,  2  doses of dialudid.  Had improved, but intermittently pain recurs.  D/w urology- they will review the CT scan and come to evaluate the patient in the ED.  Labs Review Labs Reviewed  URINALYSIS, ROUTINE W REFLEX MICROSCOPIC - Abnormal; Notable for the following:    Hgb urine dipstick LARGE (*)    All other components within normal limits  CBC - Abnormal; Notable for the following:    WBC 14.1 (*)    RBC 4.21 (*)    HCT 38.3 (*)    All other components within normal limits  BASIC METABOLIC PANEL - Abnormal; Notable for the following:    Sodium 136 (*)    Glucose, Bld 103 (*)    GFR calc non Af Amer 81 (*)    All other components within normal limits  URINE MICROSCOPIC-ADD ON    Imaging Review No results found.   EKG Interpretation None      MDM   Final diagnoses:  Renal colic on right side    Pt with right sided flank and abdominal pain.  Known ureteral stone diagnosed a couple of days ago.  Pain had been controlled until tonight- he is feeling better after pain meds.  Kidney function reassuring, no fevers, urine without signs of infection.  Pt continuing to require pain meds in the ED.  D/w urology, Dr. Brunilda PayorNesi, who will see patient in the ED.  Signed out to Dr. Silverio Layyao at end of shift pending urology consult.   Nursing notes including past medical history and social history reviewed and considered in documentation Prior records reviewed and considered during this visit     Ethelda ChickMartha K Linker, MD 07/11/13 1221  Ethelda ChickMartha K Linker, MD 07/11/13 [phone]

## 2013-07-08 ENCOUNTER — Encounter (HOSPITAL_BASED_OUTPATIENT_CLINIC_OR_DEPARTMENT_OTHER): Payer: Self-pay | Admitting: Urology

## 2013-07-08 NOTE — Progress Notes (Signed)
Patient received a total of of fentanyl not the 200 that was charted.

## 2013-07-09 ENCOUNTER — Emergency Department (HOSPITAL_COMMUNITY)
Admission: EM | Admit: 2013-07-09 | Discharge: 2013-07-09 | Disposition: A | Discharge status: Home / Self Care | Payer: BC Managed Care – PPO | Attending: Emergency Medicine | Admitting: Emergency Medicine

## 2013-07-09 ENCOUNTER — Encounter (HOSPITAL_COMMUNITY): Payer: Self-pay | Admitting: Emergency Medicine

## 2013-07-09 DIAGNOSIS — Z79899 Other long term (current) drug therapy: Secondary | ICD-10-CM | POA: Insufficient documentation

## 2013-07-09 DIAGNOSIS — F17200 Nicotine dependence, unspecified, uncomplicated: Secondary | ICD-10-CM | POA: Insufficient documentation

## 2013-07-09 DIAGNOSIS — R110 Nausea: Secondary | ICD-10-CM | POA: Insufficient documentation

## 2013-07-09 DIAGNOSIS — Z87442 Personal history of urinary calculi: Secondary | ICD-10-CM | POA: Insufficient documentation

## 2013-07-09 DIAGNOSIS — N23 Unspecified renal colic: Secondary | ICD-10-CM | POA: Insufficient documentation

## 2013-07-09 DIAGNOSIS — N390 Urinary tract infection, site not specified: Secondary | ICD-10-CM | POA: Insufficient documentation

## 2013-07-09 LAB — CBC WITH DIFFERENTIAL/PLATELET
Basophils Absolute: 0 10*3/uL (ref 0.0–0.1)
Basophils Relative: 0 % (ref 0–1)
EOS PCT: 1 % (ref 0–5)
Eosinophils Absolute: 0.1 10*3/uL (ref 0.0–0.7)
HEMATOCRIT: 35.3 % — AB (ref 39.0–52.0)
Hemoglobin: 11.8 g/dL — ABNORMAL LOW (ref 13.0–17.0)
Lymphocytes Relative: 26 % (ref 12–46)
Lymphs Abs: 1.9 10*3/uL (ref 0.7–4.0)
MCH: 30.7 pg (ref 26.0–34.0)
MCHC: 33.4 g/dL (ref 30.0–36.0)
MCV: 91.9 fL (ref 78.0–100.0)
MONO ABS: 0.7 10*3/uL (ref 0.1–1.0)
Monocytes Relative: 9 % (ref 3–12)
Neutro Abs: 4.7 10*3/uL (ref 1.7–7.7)
Neutrophils Relative %: 64 % (ref 43–77)
Platelets: 210 10*3/uL (ref 150–400)
RBC: 3.84 MIL/uL — ABNORMAL LOW (ref 4.22–5.81)
RDW: 12.2 % (ref 11.5–15.5)
WBC: 7.3 10*3/uL (ref 4.0–10.5)

## 2013-07-09 LAB — URINALYSIS, ROUTINE W REFLEX MICROSCOPIC
GLUCOSE, UA: NEGATIVE mg/dL
Ketones, ur: NEGATIVE mg/dL
Nitrite: NEGATIVE
Protein, ur: 100 mg/dL — AB
SPECIFIC GRAVITY, URINE: 1.021 (ref 1.005–1.030)
UROBILINOGEN UA: 0.2 mg/dL (ref 0.0–1.0)
pH: 5.5 (ref 5.0–8.0)

## 2013-07-09 LAB — URINE MICROSCOPIC-ADD ON

## 2013-07-09 MED ORDER — SODIUM CHLORIDE 0.9 % IV BOLUS (SEPSIS)
1000.0000 mL | Freq: Once | INTRAVENOUS | Status: AC
  Administered 2013-07-09: 1000 mL via INTRAVENOUS

## 2013-07-09 MED ORDER — DEXTROSE 5 % IV SOLN
1.0000 g | Freq: Once | INTRAVENOUS | Status: AC
  Administered 2013-07-09: 1 g via INTRAVENOUS
  Filled 2013-07-09: qty 10

## 2013-07-09 MED ORDER — KETOROLAC TROMETHAMINE 30 MG/ML IJ SOLN
30.0000 mg | Freq: Once | INTRAMUSCULAR | Status: AC
  Administered 2013-07-09: 30 mg via INTRAVENOUS
  Filled 2013-07-09: qty 1

## 2013-07-09 MED ORDER — ONDANSETRON 4 MG PO TBDP
4.0000 mg | ORAL_TABLET | Freq: Once | ORAL | Status: DC
  Filled 2013-07-09: qty 1

## 2013-07-09 MED ORDER — CEPHALEXIN 500 MG PO CAPS: 500.0000 mg | ORAL_CAPSULE | Freq: Three times a day (TID) | ORAL | Status: DC

## 2013-07-09 NOTE — ED Notes (Signed)
Pt reports onset of flank pain around 0300 this am. Took a percocet and pain subsided but started back up around 0500.  Pt reports hx of kidney stones and had a stent placed yesterday.  Pt still denies nausea at this time.

## 2013-07-09 NOTE — ED Provider Notes (Signed)
CSN: [id number]     Arrival date & time 07/09/13  [id number] History   First MD Initiated Contact with Patient 07/09/13 0531     Chief Complaint  Patient presents with  . Flank Pain     (Consider location/radiation/quality/duration/timing/severity/associated sxs/prior Treatment) HPI Patient is a 33 yo man who is POD #1 s/p left ureteral stent placement by Dr. Brunilda PayorNesi for 4mm distal ureteral calculus which was successfully retrieved. The patient was doing well postoperatively until couple of hours prior to arrival when he developed severe left flank pain associated with nausea. His pain is reminiscent of the pain which he experienced prior to ureteroscopy and stent placement with stone extraction. He denies fever. He has not vomited.  At worst, his pain was 10 on a 0-to-10 scale. Currently, he rates it a 4. Nothing makes the pain worse or better. The patient does note some mild burning dysuria.  Past Medical History  Diagnosis Date  . Left ureteral calculus    Past Surgical History  Procedure Laterality Date  . Cystoscopy/retrograde/ureteroscopy Left 07/07/2013    Procedure: CYSTOSCOPY/RETROGRADE/URETEROSCOPY WITH STONE EXTRACTION;  Surgeon: Danae ChenMarc H Nesi, MD;  [facility] [place];  Service: Urology;  Laterality: Left;  . Cystoscopy with stent placement Left 07/07/2013    Procedure: CYSTOSCOPY WITH STENT PLACEMENT;  Surgeon: Danae ChenMarc H Nesi, MD;  [facility] [place];  Service: Urology;  Laterality: Left;  . Holmium laser application Left 07/07/2013    Procedure: HOLMIUM LASER APPLICATION;  Surgeon: Danae ChenMarc H Nesi, MD;  [facility] [place];  Service: Urology;  Laterality: Left;  . Tonsillectomy     Family History  Problem Relation Age of Onset  . Adopted: Yes   History  Substance Use Topics  . Smoking status: Light Tobacco Smoker    Types: Cigarettes  . Smokeless tobacco: Never Used  . Alcohol Use: Yes     Comment: daily    Review of  Systems  Ten point review of symptoms performed and is negative with the exception of symptoms noted above.    Allergies  Dilaudid  Home Medications   Prior to Admission medications   Medication Sig Start Date End Date Taking? Authorizing Provider  ibuprofen (ADVIL,MOTRIN) 200 MG tablet Take 400 mg by mouth every 6 (six) hours as needed for moderate pain.   Yes Historical Provider, MD  ondansetron (ZOFRAN-ODT) 8 MG disintegrating tablet Take 1 tablet (8 mg total) by mouth every 8 (eight) hours as needed for nausea or vomiting. 07/05/13  Yes Juliet RudeNathan R. Pickering, MD  oxyCODONE-acetaminophen (PERCOCET/ROXICET) 5-325 MG per tablet Take 1-2 tablets by mouth every 6 (six) hours as needed for severe pain. 07/05/13  Yes Nathan R. Pickering, MD  tamsulosin (FLOMAX) 0.4 MG CAPS capsule Take 1 capsule (0.4 mg total) by mouth daily. 07/05/13  Yes Nathan R. Pickering, MD   BP 132/88  Pulse 79  Temp(Src) 98.5 F (36.9 C) (Oral)  Resp 20  SpO2 94% Physical Exam Gen: well developed and well nourished appearing Head: NCAT Eyes: PERL, EOMI Nose: no epistaixis or rhinorrhea Mouth/throat: mucosa is moist and pink Neck: supple, no stridor Lungs: CTA B, no wheezing, rhonchi or rales CV: RRR, no murmur, extremities appear well perfused.  Abd: soft, notender, nondistended Back: no ttp, no cva ttp Skin: warm and dry Ext: normal to inspection, no dependent edema Neuro: CN ii-xii grossly intact, no focal deficits Psyche; normal affect,  calm and cooperative.   ED Course  Procedures (including critical care time)  Labs Review Labs Reviewed  URINALYSIS, ROUTINE W REFLEX MICROSCOPIC - Abnormal; Notable for the following:    Color, Urine BROWN (*)    APPearance TURBID (*)    Hgb urine dipstick LARGE (*)    Bilirubin Urine SMALL (*)    Protein, ur 100 (*)    Leukocytes, UA MODERATE (*)    All other components within normal limits  URINE CULTURE  URINE MICROSCOPIC-ADD ON  CBC WITH DIFFERENTIAL      MDM   DDX: ureteral spasm, will rule out UTI.   UTI present. Urine for culture. Empiric Ceftriaxone in ED. Checking CBC. Plan to discharge home with Keflex and strict return to ED precautions and plan to follow up with Dr. Brunilda PayorNesi in the office on Monday.   Case discussed with Urologist on call who has reviewed the patient's urinalysis. We have agreed to discharge the patient on Keflex with plan to f/u in office on Monday.     Brandt LoosenJulie Manly, MD 07/09/13 [id number]

## 2013-07-09 NOTE — ED Notes (Signed)
Pt states he has kidney stones and had a stent placed yesterday  Pt states he woke up tonight with flank pain that he states progressively got worse  Pt states he took 2 percocet at home and his pain has decreased but it is still very painful   Pt states it is a constant 5 but spikes up

## 2013-07-09 NOTE — Discharge Instructions (Signed)
PLEASE RETURN PROMPTLY TO THE ED IF YOU DEVELOP A FEVER OR IF YOU HAVE UNCONTROLLED PAIN.

## 2013-07-10 LAB — URINE CULTURE
CULTURE: NO GROWTH
Colony Count: NO GROWTH
Special Requests: NORMAL

## 2013-07-11 ENCOUNTER — Encounter (HOSPITAL_COMMUNITY): Payer: Self-pay | Admitting: Emergency Medicine

## 2013-07-11 ENCOUNTER — Emergency Department (HOSPITAL_COMMUNITY)
Admission: EM | Admit: 2013-07-11 | Discharge: 2013-07-11 | Disposition: A | Discharge status: Home / Self Care | Payer: BC Managed Care – PPO | Attending: Emergency Medicine | Admitting: Emergency Medicine

## 2013-07-11 ENCOUNTER — Emergency Department (HOSPITAL_COMMUNITY): Payer: BC Managed Care – PPO

## 2013-07-11 DIAGNOSIS — Z79899 Other long term (current) drug therapy: Secondary | ICD-10-CM | POA: Diagnosis not present

## 2013-07-11 DIAGNOSIS — Z87442 Personal history of urinary calculi: Secondary | ICD-10-CM | POA: Insufficient documentation

## 2013-07-11 DIAGNOSIS — R109 Unspecified abdominal pain: Secondary | ICD-10-CM | POA: Insufficient documentation

## 2013-07-11 DIAGNOSIS — F17200 Nicotine dependence, unspecified, uncomplicated: Secondary | ICD-10-CM | POA: Insufficient documentation

## 2013-07-11 DIAGNOSIS — Z792 Long term (current) use of antibiotics: Secondary | ICD-10-CM | POA: Insufficient documentation

## 2013-07-11 DIAGNOSIS — R0602 Shortness of breath: Secondary | ICD-10-CM | POA: Diagnosis not present

## 2013-07-11 DIAGNOSIS — Z87440 Personal history of urinary (tract) infections: Secondary | ICD-10-CM | POA: Diagnosis not present

## 2013-07-11 DIAGNOSIS — R319 Hematuria, unspecified: Secondary | ICD-10-CM | POA: Diagnosis not present

## 2013-07-11 LAB — COMPREHENSIVE METABOLIC PANEL
ALBUMIN: 4.1 g/dL (ref 3.5–5.2)
ALT: 18 U/L (ref 0–53)
AST: 17 U/L (ref 0–37)
Alkaline Phosphatase: 61 U/L (ref 39–117)
BILIRUBIN TOTAL: 0.6 mg/dL (ref 0.3–1.2)
BUN: 16 mg/dL (ref 6–23)
CALCIUM: 10.1 mg/dL (ref 8.4–10.5)
CHLORIDE: 103 meq/L (ref 96–112)
CO2: 28 mEq/L (ref 19–32)
Creatinine, Ser: 0.94 mg/dL (ref 0.50–1.35)
GFR calc Af Amer: >90 mL/min (ref >90)
GFR calc non Af Amer: >90 mL/min (ref >90)
Glucose, Bld: 97 mg/dL (ref 70–99)
Potassium: 4.5 mEq/L (ref 3.7–5.3)
Sodium: 142 mEq/L (ref 137–147)
Total Protein: 7.7 g/dL (ref 6.0–8.3)

## 2013-07-11 LAB — CBC WITH DIFFERENTIAL/PLATELET
BASOS ABS: 0 10*3/uL (ref 0.0–0.1)
Basophils Relative: 0 % (ref 0–1)
Eosinophils Absolute: 0.2 10*3/uL (ref 0.0–0.7)
Eosinophils Relative: 2 % (ref 0–5)
HCT: 44.8 % (ref 39.0–52.0)
Hemoglobin: 15.2 g/dL (ref 13.0–17.0)
Lymphocytes Relative: 23 % (ref 12–46)
Lymphs Abs: 1.7 10*3/uL (ref 0.7–4.0)
MCH: 30.8 pg (ref 26.0–34.0)
MCHC: 33.9 g/dL (ref 30.0–36.0)
MCV: 90.7 fL (ref 78.0–100.0)
MONO ABS: 0.6 10*3/uL (ref 0.1–1.0)
Monocytes Relative: 9 % (ref 3–12)
NEUTROS ABS: 4.7 10*3/uL (ref 1.7–7.7)
NEUTROS PCT: 66 % (ref 43–77)
Platelets: 283 10*3/uL (ref 150–400)
RBC: 4.94 MIL/uL (ref 4.22–5.81)
RDW: 11.9 % (ref 11.5–15.5)
WBC: 7.2 10*3/uL (ref 4.0–10.5)

## 2013-07-11 LAB — URINALYSIS, ROUTINE W REFLEX MICROSCOPIC
Glucose, UA: NEGATIVE mg/dL
KETONES UR: NEGATIVE mg/dL
NITRITE: NEGATIVE
PROTEIN: 100 mg/dL — AB
Specific Gravity, Urine: 1.03 (ref 1.005–1.030)
Urobilinogen, UA: 1 mg/dL (ref 0.0–1.0)
pH: 5.5 (ref 5.0–8.0)

## 2013-07-11 LAB — URINE MICROSCOPIC-ADD ON

## 2013-07-11 MED ORDER — DEXTROSE 5 % IV SOLN
1.0000 g | Freq: Once | INTRAVENOUS | Status: AC
  Administered 2013-07-11: 1 g via INTRAVENOUS
  Filled 2013-07-11: qty 10

## 2013-07-11 MED ORDER — SODIUM CHLORIDE 0.9 % IV BOLUS (SEPSIS)
1000.0000 mL | INTRAVENOUS | Status: AC
  Administered 2013-07-11: 1000 mL via INTRAVENOUS

## 2013-07-11 MED ORDER — MORPHINE SULFATE 4 MG/ML IJ SOLN
4.0000 mg | Freq: Once | INTRAMUSCULAR | Status: DC
  Filled 2013-07-11: qty 1

## 2013-07-11 MED ORDER — ONDANSETRON HCL 4 MG/2ML IJ SOLN
4.0000 mg | Freq: Once | INTRAMUSCULAR | Status: AC
  Administered 2013-07-11: 4 mg via INTRAVENOUS
  Filled 2013-07-11: qty 2

## 2013-07-11 MED ORDER — LORAZEPAM 0.5 MG PO TABS: 0.5000 mg | ORAL_TABLET | Freq: Once | ORAL | Status: DC

## 2013-07-11 MED ORDER — SODIUM CHLORIDE 0.9 % IV BOLUS (SEPSIS)
1000.0000 mL | Freq: Once | INTRAVENOUS | Status: AC
  Administered 2013-07-11: 1000 mL via INTRAVENOUS

## 2013-07-11 MED ORDER — OXYCODONE-ACETAMINOPHEN 5-325 MG PO TABS: 1.0000 | ORAL_TABLET | Freq: Four times a day (QID) | ORAL | Status: DC | PRN

## 2013-07-11 MED ORDER — MORPHINE SULFATE 4 MG/ML IJ SOLN
4.0000 mg | Freq: Once | INTRAMUSCULAR | Status: AC
  Administered 2013-07-11: 4 mg via INTRAVENOUS
  Filled 2013-07-11: qty 1

## 2013-07-11 NOTE — ED Notes (Signed)
MD at bedside. 

## 2013-07-11 NOTE — ED Provider Notes (Signed)
CSN: [id number]     Arrival date & time 07/11/13  [id number] History   First MD Initiated Contact with Patient 07/11/13 1100     Chief Complaint  Patient presents with  . Flank Pain  . Shortness of Breath     (Consider location/radiation/quality/duration/timing/severity/associated sxs/prior Treatment) HPI  Patient presents to the ER with complaints of left flank pain. He had a stent placed and stone removed on 6/25. Was seen here again on 6/27 and was told that he had a UTI. He felt fine yesterday and had no pain but today his pain has significantly worsened and excruciating. He spoke with Dr. Brunilda Payor on the phone who said the stent will probably need to be removed. The patient and his wife decided to come to the ED for pain control, they admit Urology did not send them here. He has not had fever, nausea, vomiting or diarrhea. He has been taking prescribed medication.   Past Medical History  Diagnosis Date  . Left ureteral calculus    Past Surgical History  Procedure Laterality Date  . Cystoscopy/retrograde/ureteroscopy Left 07/07/2013    Procedure: CYSTOSCOPY/RETROGRADE/URETEROSCOPY WITH STONE EXTRACTION;  Surgeon: Danae Chen, MD;  Location: [facility];  Service: Urology;  Laterality: Left;  . Cystoscopy with stent placement Left 07/07/2013    Procedure: CYSTOSCOPY WITH STENT PLACEMENT;  Surgeon: Danae Chen, MD;  [facility]: [facility];  Service: Urology;  Laterality: Left;  . Holmium laser application Left 07/07/2013    Procedure: HOLMIUM LASER APPLICATION;  Surgeon: Danae Chen, MD;  [facility];  Service: Urology;  Laterality: Left;  . Tonsillectomy     Family History  Problem Relation Age of Onset  . Adopted: Yes   History  Substance Use Topics  . Smoking status: Light Tobacco Smoker    Types: Cigarettes  . Smokeless tobacco: Never Used  . Alcohol Use: Yes     Comment: daily    Review of Systems   Review of Systems  Gen:  no weight loss, fevers, chills, night sweats  Eyes: no discharge or drainage, no occular pain or visual changes  Nose: no epistaxis or rhinorrhea  Mouth: no dental pain, no sore throat  Neck: no neck pain  Lungs:No wheezing, coughing or hemoptysis CV: no chest pain, palpitations, dependent edema or orthopnea  Abd: no abdominal pain, nausea, vomiting, diarrhea GU: + hematuria and left flank pain MSK:  No muscle weakness or pain Neuro: no headache, no focal neurologic deficits  Skin: no rash or wounds Psyche: no complaints    Allergies  Dilaudid  Home Medications   Prior to Admission medications   Medication Sig Start Date End Date Taking? Authorizing Layana Konkel  cephALEXin (KEFLEX) 500 MG capsule Take 1 capsule (500 mg total) by mouth 3 (three) times daily. 07/09/13  Yes Brandt Loosen, MD  ibuprofen (ADVIL,MOTRIN) 200 MG tablet Take 400 mg by mouth every 6 (six) hours as needed for moderate pain.   Yes Historical Jermey Closs, MD  ondansetron (ZOFRAN-ODT) 8 MG disintegrating tablet Take 1 tablet (8 mg total) by mouth every 8 (eight) hours as needed for nausea or vomiting. 07/05/13  Yes Juliet Rude. Pickering, MD  oxyCODONE-acetaminophen (PERCOCET/ROXICET) 5-325 MG per tablet Take 1-2 tablets by mouth every 6 (six) hours as needed for severe pain. 07/05/13  Yes Nathan R. Pickering, MD  tamsulosin (FLOMAX) 0.4 MG CAPS capsule Take 1 capsule (0.4 mg total) by mouth daily. 07/05/13  Yes Nathan R. Rubin Payor, MD  oxyCODONE-acetaminophen (PERCOCET/ROXICET) 5-325 MG per tablet Take 1-2 tablets by mouth every 6 (six) hours as needed for severe pain. 07/11/13   Tiffany Irine SealG Greene, PA-C   BP 135/84  Pulse 47  Temp(Src) 97.6 F (36.4 C) (Oral)  Resp 16  SpO2 95% Physical Exam  Nursing note and vitals reviewed. Constitutional: He appears well-developed and well-nourished. No distress.  HENT:  Head: Normocephalic and atraumatic.  Eyes: Pupils are equal, round, and reactive to light.  Neck: Normal range of  motion. Neck supple.  Cardiovascular: Normal rate and regular rhythm.   Pulmonary/Chest: Effort normal.  Abdominal: Soft. Bowel sounds are normal. He exhibits no distension and no fluid wave. There is tenderness. There is CVA tenderness (left).  Neurological: He is alert.  Skin: Skin is warm and dry.    ED Course  Procedures (including critical care time) Labs Review Labs Reviewed  URINALYSIS, ROUTINE W REFLEX MICROSCOPIC - Abnormal; Notable for the following:    Color, Urine RED (*)    APPearance TURBID (*)    Hgb urine dipstick LARGE (*)    Bilirubin Urine MODERATE (*)    Protein, ur 100 (*)    Leukocytes, UA MODERATE (*)    All other components within normal limits  URINE MICROSCOPIC-ADD ON - Abnormal; Notable for the following:    Bacteria, UA MANY (*)    All other components within normal limits  CBC WITH DIFFERENTIAL  COMPREHENSIVE METABOLIC PANEL    Imaging Review [facility]  07/11/2013   CLINICAL DATA:  Shortness of breath, left nephrostomy  EXAM: RENAL/URINARY TRACT ULTRASOUND COMPLETE  COMPARISON:  None.  FINDINGS: Right Kidney:  Length: 11.7 cm. Echogenicity within normal limits. No mass or hydronephrosis visualized.  Left Kidney:  Length: 12.7 cm. Left nephroureteral stent. Echogenicity within normal limits. No mass or hydronephrosis visualized.  Bladder:  Appears normal for degree of bladder distention.  IMPRESSION: 1. No obstructive uropathy. 2. Left nephroureteral stent in satisfactory position.   Electronically Signed   By: Elige KoHetal  Patel   On: 07/11/2013 14:07     EKG Interpretation None      MDM   Final diagnoses:  Left flank pain   Medications  morphine 4 MG/ML injection 4 mg (not administered)  sodium chloride 0.9 % bolus 1,000 mL (0 mLs Intravenous Stopped 07/11/13 1356)  ondansetron (ZOFRAN) injection 4 mg (4 mg Intravenous Given 07/11/13 1221)  morphine 4 MG/ML injection 4 mg (4 mg Intravenous Given 07/11/13 1221)  cefTRIAXone (ROCEPHIN) 1 g in dextrose  5 % 50 mL IVPB (0 g Intravenous Stopped 07/11/13 1356)    Patients pain controlled in the ED. HE has an appointment with Dr. Brunilda PayorNesi at 2pm (they called the office to schedule follow-up and did not realize he had the appointment)  Renal US is normal, will discharge patient and send to the office since it is almost 2 pm --> patient to go straight to Dr. Brunilda PayorNesi office  32 y.o.Timoteo Berhow's evaluation in the Emergency Department is complete. It has been determined that no acute conditions requiring further emergency intervention are present at this time. The patient/guardian have been advised of the diagnosis and plan. We have discussed signs and symptoms that warrant return to the ED, such as changes or worsening in symptoms.  Vital signs are stable at discharge. Filed Vitals:   07/11/13 1402  BP:   Pulse:   Temp:   Resp: 16    Patient/guardian has voiced understanding and agreed to follow-up with the PCP or  specialist.     Dorthula Matasiffany G Greene, PA-C 07/11/13 1335  Dorthula Matasiffany G Greene, PA-C 07/11/13 [id number]

## 2013-07-11 NOTE — ED Provider Notes (Signed)
Medical screening examination/treatment/procedure(s) were performed by non-physician practitioner and as supervising physician I was immediately available for consultation/collaboration.   EKG Interpretation None        Stephen Rancour, MD 07/11/13 1617 

## 2013-07-11 NOTE — ED Notes (Signed)
Urine collected and at bedside.

## 2013-07-11 NOTE — Discharge Instructions (Signed)
Flank Pain °Flank pain refers to pain that is located on the side of the body between the upper abdomen and the back. The pain may occur over a short period of time (acute) or may be long-term or reoccurring (chronic). It may be mild or severe. Flank pain can be caused by many things. °CAUSES  °Some of the more common causes of flank pain include: °· Muscle strains.   °· Muscle spasms.   °· A disease of your spine (vertebral disk disease).   °· A lung infection (pneumonia).   °· Fluid around your lungs (pulmonary edema).   °· A kidney infection.   °· Kidney stones.   °· A very painful skin rash caused by the chickenpox virus (shingles).   °· Gallbladder disease.   °HOME CARE INSTRUCTIONS  °Home care will depend on the cause of your pain. In general, °· Rest as directed by your caregiver. °· Drink enough fluids to keep your urine clear or pale yellow. °· Only take over-the-counter or prescription medicines as directed by your caregiver. Some medicines may help relieve the pain. °· Tell your caregiver about any changes in your pain. °· Follow up with your caregiver as directed. °SEEK IMMEDIATE MEDICAL CARE IF:  °· Your pain is not controlled with medicine.   °· You have new or worsening symptoms. °· Your pain increases.   °· You have abdominal pain.   °· You have shortness of breath.   °· You have persistent nausea or vomiting.   °· You have swelling in your abdomen.   °· You feel faint or pass out.   °· You have blood in your urine. °· You have a fever or persistent symptoms for more than 2-3 days. °· You have a fever and your symptoms suddenly get worse. °MAKE SURE YOU:  °· Understand these instructions. °· Will watch your condition. °· Will get help right away if you are not doing well or get worse. °Document Released: 02/20/2005 Document Revised: 09/24/2011 Document Reviewed: 08/14/2011 °[employer]® Patient Information ©2015 [employer]. This information is not intended to replace advice given to you by your  health care provider. Make sure you discuss any questions you have with your health care provider. ° °

## 2013-07-11 NOTE — ED Notes (Signed)
Bed: WA21 Expected date:  Expected time:  Means of arrival:  Comments: 

## 2013-07-11 NOTE — ED Notes (Signed)
Pt has been seen for flank pain and had stones removed on 6/25.  Came in Saturday 6/27 and told he has a UTI.  Pain continues on left side with no improvement.  Pt taking prescribed antibiotics.

## 2013-07-11 NOTE — ED Provider Notes (Signed)
CSN: [id number]     Arrival date & time 07/11/13  1932 History   First MD Initiated Contact with Patient 07/11/13 2005     Chief Complaint  Patient presents with  . Flank Pain     (Consider location/radiation/quality/duration/timing/severity/associated sxs/prior Treatment) Patient is a 33 y.o. male presenting with flank pain. The history is provided by the patient.  Flank Pain This is a recurrent problem. The current episode started 3 to 5 hours ago. The problem occurs constantly. The problem has been resolved. Pertinent negatives include no chest pain, no abdominal pain, no headaches and no shortness of breath. Nothing aggravates the symptoms. Relieved by: percocet x 3. Treatments tried: percocet. The treatment provided significant relief.    Past Medical History  Diagnosis Date  . Left ureteral calculus    Past Surgical History  Procedure Laterality Date  . Cystoscopy/retrograde/ureteroscopy Left 07/07/2013    Procedure: CYSTOSCOPY/RETROGRADE/URETEROSCOPY WITH STONE EXTRACTION;  Surgeon: Danae ChenMarc H Nesi, MD;  [facility] [place];  Service: Urology;  Laterality: Left;  . Cystoscopy with stent placement Left 07/07/2013    Procedure: CYSTOSCOPY WITH STENT PLACEMENT;  Surgeon: Danae ChenMarc H Nesi, MD;  [facility] [place];  Service: Urology;  Laterality: Left;  . Holmium laser application Left 07/07/2013    Procedure: HOLMIUM LASER APPLICATION;  Surgeon: Danae ChenMarc H Nesi, MD;  [facility] [place];  Service: Urology;  Laterality: Left;  . Tonsillectomy     Family History  Problem Relation Age of Onset  . Adopted: Yes   History  Substance Use Topics  . Smoking status: Light Tobacco Smoker    Types: Cigarettes  . Smokeless tobacco: Never Used  . Alcohol Use: Yes     Comment: daily    Review of Systems  Constitutional: Negative for fever.  HENT: Negative for drooling and rhinorrhea.   Eyes: Negative for pain.  Respiratory: Negative for cough and  shortness of breath.   Cardiovascular: Negative for chest pain and leg swelling.  Gastrointestinal: Negative for nausea, vomiting, abdominal pain and diarrhea.       Left flank pain  Genitourinary: Positive for flank pain. Negative for dysuria and hematuria.  Musculoskeletal: Negative for gait problem and neck pain.  Skin: Negative for color change.  Neurological: Negative for numbness and headaches.  Hematological: Negative for adenopathy.  Psychiatric/Behavioral: Negative for behavioral problems.  All other systems reviewed and are negative.     Allergies  Dilaudid  Home Medications   Prior to Admission medications   Medication Sig Start Date End Date Taking? Authorizing Provider  cephALEXin (KEFLEX) 500 MG capsule Take 500 mg by mouth 3 (three) times daily. 07/09/13 07/18/13 Yes Historical Provider, MD  ibuprofen (ADVIL,MOTRIN) 200 MG tablet Take 400 mg by mouth every 6 (six) hours as needed for moderate pain.   Yes Historical Provider, MD  ondansetron (ZOFRAN-ODT) 8 MG disintegrating tablet Take 8 mg by mouth every 8 (eight) hours as needed for nausea or vomiting.   Yes Historical Provider, MD  oxyCODONE-acetaminophen (PERCOCET/ROXICET) 5-325 MG per tablet Take 1-2 tablets by mouth every 4 (four) hours as needed for severe pain.   Yes Historical Provider, MD   BP 108/91  Pulse 80  Temp(Src) 97.5 F (36.4 C) (Oral)  Resp 20  Ht 6' (1.829 m)  Wt 194 lb 3.2 oz (88.089 kg)  BMI 26.33 kg/m2  SpO2 98% Physical Exam  Nursing note and vitals reviewed. Constitutional: He is oriented to person, place, and time. He appears well-developed and  well-nourished.  HENT:  Head: Normocephalic and atraumatic.  Right Ear: External ear normal.  Left Ear: External ear normal.  Nose: Nose normal.  Mouth/Throat: Oropharynx is clear and moist. No oropharyngeal exudate.  Eyes: Conjunctivae and EOM are normal. Pupils are equal, round, and reactive to light.  Neck: Normal range of motion. Neck  supple.  Cardiovascular: Normal rate, regular rhythm, normal heart sounds and intact distal pulses.  Exam reveals no gallop and no friction rub.   No murmur heard. Pulmonary/Chest: Effort normal and breath sounds normal. No respiratory distress. He has no wheezes.  Abdominal: Soft. Bowel sounds are normal. He exhibits no distension. There is no tenderness. There is no rebound and no guarding.  Musculoskeletal: Normal range of motion. He exhibits no edema and no tenderness.  Neurological: He is alert and oriented to person, place, and time.  Skin: Skin is warm and dry.  Psychiatric: He has a normal mood and affect. His behavior is normal.    ED Course  Procedures (including critical care time) Labs Review Labs Reviewed - No data to display  Imaging Review [facility]  07/11/2013   CLINICAL DATA:  Shortness of breath, left nephrostomy  EXAM: RENAL/URINARY TRACT ULTRASOUND COMPLETE  COMPARISON:  None.  FINDINGS: Right Kidney:  Length: 11.7 cm. Echogenicity within normal limits. No mass or hydronephrosis visualized.  Left Kidney:  Length: 12.7 cm. Left nephroureteral stent. Echogenicity within normal limits. No mass or hydronephrosis visualized.  Bladder:  Appears normal for degree of bladder distention.  IMPRESSION: 1. No obstructive uropathy. 2. Left nephroureteral stent in satisfactory position.   Electronically Signed   By: Elige KoHetal  Patel   On: 07/11/2013 14:07     EKG Interpretation None      MDM   Final diagnoses:  Left flank pain    8:38 PM 33 y.o. male who has been seen here multiple times recently for left-sided kidney stone status post stent placement and then removal of the stent today. The patient was seen here earlier today and had noncontributory labs and ultrasound. Urine was suspicious for a UTI and he was started on antibiotics. This was discussed with urology and since discharge he has seen urology and had the ureteral stent removed. He states that at approximately 4 PM he  began having recurrence of his left-sided flank pain consistent with nephrolithiasis. He took 2 Percocet and the pain continued. He took a third Percocet and states pains are resolving around 7 PM. He currently has 1/10 pain and denies any nausea. He states that he is now feeling much better on exam. Will observe for a short period time and make sure he does not have recurrence of pain which is likely due to ureteral spasm. I do not think we need to repeat labs, urinalysis, or imaging. The patient is happy with this plan.  10:31 PM: No meds given. Pt continues to feel well. Pt has percocet Rx already. I have discussed the diagnosis/risks/treatment options with the patient and believe the pt to be eligible for discharge home to follow-up with his urologist as needed. We also discussed returning to the ED immediately if new or worsening sx occur. We discussed the sx which are most concerning (e.g., worsening pain, fever, inc vomiting) that necessitate immediate return. Medications administered to the patient during their visit and any new prescriptions provided to the patient are listed below.  Medications given during this visit Medications  sodium chloride 0.9 % bolus 1,000 mL (1,000 mLs Intravenous New Bag/Given 07/11/13  2041)    New Prescriptions   No medications on file     Junius Argyle, MD 07/11/13 2350

## 2013-07-11 NOTE — ED Notes (Signed)
Patient transported to Ultrasound 

## 2013-07-11 NOTE — ED Notes (Signed)
Pt states that he has been seen multiple times over the last week for kidney stones. Pt had a stent placed on Thursday, which was removed today.  Since the stent removal , pt has had increasing pain in the left flank. Pt is unable to sit still and is pacing in obvious pain. Pt states he has been nauseous but took Zofran earlier today. Pt has voided and produced clear urine.

## 2013-07-11 NOTE — ED Notes (Signed)
Pt also reports he has been SOB the last couple of days.

## 2013-07-13 ENCOUNTER — Encounter (HOSPITAL_COMMUNITY): Payer: Self-pay | Admitting: Emergency Medicine

## 2013-07-13 ENCOUNTER — Emergency Department (HOSPITAL_COMMUNITY)
Admission: EM | Admit: 2013-07-13 | Discharge: 2013-07-13 | Disposition: A | Discharge status: Home / Self Care | Payer: BC Managed Care – PPO | Attending: Emergency Medicine | Admitting: Emergency Medicine

## 2013-07-13 DIAGNOSIS — Z79899 Other long term (current) drug therapy: Secondary | ICD-10-CM | POA: Insufficient documentation

## 2013-07-13 DIAGNOSIS — Z792 Long term (current) use of antibiotics: Secondary | ICD-10-CM | POA: Insufficient documentation

## 2013-07-13 DIAGNOSIS — F17200 Nicotine dependence, unspecified, uncomplicated: Secondary | ICD-10-CM | POA: Insufficient documentation

## 2013-07-13 DIAGNOSIS — R109 Unspecified abdominal pain: Secondary | ICD-10-CM | POA: Insufficient documentation

## 2013-07-13 DIAGNOSIS — R319 Hematuria, unspecified: Secondary | ICD-10-CM | POA: Insufficient documentation

## 2013-07-13 DIAGNOSIS — Z87442 Personal history of urinary calculi: Secondary | ICD-10-CM | POA: Insufficient documentation

## 2013-07-13 LAB — URINALYSIS, ROUTINE W REFLEX MICROSCOPIC
Bilirubin Urine: NEGATIVE
GLUCOSE, UA: NEGATIVE mg/dL
KETONES UR: NEGATIVE mg/dL
Nitrite: NEGATIVE
Protein, ur: 30 mg/dL — AB
Specific Gravity, Urine: 1.024 (ref 1.005–1.030)
Urobilinogen, UA: 0.2 mg/dL (ref 0.0–1.0)
pH: 6 (ref 5.0–8.0)

## 2013-07-13 LAB — BASIC METABOLIC PANEL
Anion gap: 15 (ref 5–15)
BUN: 19 mg/dL (ref 6–23)
CO2: 24 meq/L (ref 19–32)
Calcium: 9.8 mg/dL (ref 8.4–10.5)
Chloride: 99 mEq/L (ref 96–112)
Creatinine, Ser: 1.13 mg/dL (ref 0.50–1.35)
GFR calc Af Amer: >90 mL/min (ref >90)
GFR calc non Af Amer: 84 mL/min — ABNORMAL LOW (ref >90)
GLUCOSE: 116 mg/dL — AB (ref 70–99)
Potassium: 4.5 mEq/L (ref 3.7–5.3)
Sodium: 138 mEq/L (ref 137–147)

## 2013-07-13 LAB — URINE MICROSCOPIC-ADD ON

## 2013-07-13 LAB — CBC WITH DIFFERENTIAL/PLATELET
Basophils Absolute: 0 10*3/uL (ref 0.0–0.1)
Basophils Relative: 1 % (ref 0–1)
EOS ABS: 0.2 10*3/uL (ref 0.0–0.7)
EOS PCT: 2 % (ref 0–5)
HEMATOCRIT: 42.1 % (ref 39.0–52.0)
Hemoglobin: 14.6 g/dL (ref 13.0–17.0)
LYMPHS PCT: 29 % (ref 12–46)
Lymphs Abs: 2 10*3/uL (ref 0.7–4.0)
MCH: 31.3 pg (ref 26.0–34.0)
MCHC: 34.7 g/dL (ref 30.0–36.0)
MCV: 90.3 fL (ref 78.0–100.0)
MONO ABS: 0.7 10*3/uL (ref 0.1–1.0)
MONOS PCT: 11 % (ref 3–12)
Neutro Abs: 3.9 10*3/uL (ref 1.7–7.7)
Neutrophils Relative %: 57 % (ref 43–77)
Platelets: 284 10*3/uL (ref 150–400)
RBC: 4.66 MIL/uL (ref 4.22–5.81)
RDW: 11.9 % (ref 11.5–15.5)
WBC: 6.8 10*3/uL (ref 4.0–10.5)

## 2013-07-13 MED ORDER — MORPHINE SULFATE 4 MG/ML IJ SOLN
4.0000 mg | Freq: Once | INTRAMUSCULAR | Status: AC
Start: 2013-07-13 — End: 2013-07-13
  Administered 2013-07-13: 4 mg via INTRAVENOUS
  Filled 2013-07-13: qty 1

## 2013-07-13 MED ORDER — OXYCODONE-ACETAMINOPHEN 10-325 MG PO TABS: 1.0000 | ORAL_TABLET | ORAL | Status: AC | PRN

## 2013-07-13 MED ORDER — KETOROLAC TROMETHAMINE 30 MG/ML IJ SOLN
30.0000 mg | Freq: Once | INTRAMUSCULAR | Status: AC
  Administered 2013-07-13: 30 mg via INTRAVENOUS
  Filled 2013-07-13: qty 1

## 2013-07-13 NOTE — ED Notes (Signed)
Provided pt with urinal. Notified we needed sample. Pt said they would notify when voided.

## 2013-07-13 NOTE — ED Provider Notes (Signed)
Medical screening examination/treatment/procedure(s) were performed by non-physician practitioner and as supervising physician I was immediately available for consultation/collaboration.   EKG Interpretation None        Furkan Keenum, MD 07/13/13 2145 

## 2013-07-13 NOTE — ED Notes (Signed)
Pt sts he has been in and out of the ER for the past week due to kidney stones. Pt c/o L sided flank pain and scant amount of blood in urine. Pt denies painful urination. Pt had a stent placed on Thursday and was taken out Saturday. Pt sts he has been taking percocet without relief. Pt A&Ox4. Pt ambulatory in triage.

## 2013-07-13 NOTE — Discharge Instructions (Signed)
Take the prescribed medication as directed.  May wish to take regularly every 4-6 hours  (even if pain is mild) for the next day or so to prevent severe pain from occuring. Follow-up with Dr. Brunilda PayorNesi. Return to the ED for new or worsening symptoms.

## 2013-07-13 NOTE — ED Provider Notes (Signed)
CSN: [id number]     Arrival date & time 07/13/13  1536 History   First MD Initiated Contact with Patient 07/13/13 1609     Chief Complaint  Patient presents with  . Nephrolithiasis     (Consider location/radiation/quality/duration/timing/severity/associated sxs/prior Treatment) The history is provided by the patient and medical records.   This is a 33 year old male with history of left ureteral calculus diagnosed in the ED and status post stone retrieval and stent placement by Dr. Brunilda PayorNesi, presenting to the ED for left flank pain.  Patient seen multiple times over the past week for same complaint.  Patient states stent was removed on Monday (6/29) and has been doing well up until around 1430 this afternoon.  States pain is localized to left flank without radiation.  No associated nausea, vomiting, diarrhea.  No fever or chills.  Some hematuria noted without dysuria or difficulty voiding.  Pt states pain feels similar to prior episode on Monday after stent was removed.  Pt currently on keflex for UTI and has been taking percocet for pain control.  VS stable on arrival.  Past Medical History  Diagnosis Date  . Left ureteral calculus    Past Surgical History  Procedure Laterality Date  . Cystoscopy/retrograde/ureteroscopy Left 07/07/2013    Procedure: CYSTOSCOPY/RETROGRADE/URETEROSCOPY WITH STONE EXTRACTION;  Surgeon: Danae ChenMarc H Nesi, MD;  [facility] [place];  Service: Urology;  Laterality: Left;  . Cystoscopy with stent placement Left 07/07/2013    Procedure: CYSTOSCOPY WITH STENT PLACEMENT;  Surgeon: Danae ChenMarc H Nesi, MD;  [facility] [place];  Service: Urology;  Laterality: Left;  . Holmium laser application Left 07/07/2013    Procedure: HOLMIUM LASER APPLICATION;  Surgeon: Danae ChenMarc H Nesi, MD;  [facility] [place];  Service: Urology;  Laterality: Left;  . Tonsillectomy     Family History  Problem Relation Age of Onset  . Adopted: Yes   History   Substance Use Topics  . Smoking status: Light Tobacco Smoker    Types: Cigarettes  . Smokeless tobacco: Never Used  . Alcohol Use: Yes     Comment: daily    Review of Systems  Genitourinary: Positive for hematuria and flank pain.  All other systems reviewed and are negative.     Allergies  Dilaudid  Home Medications   Prior to Admission medications   Medication Sig Start Date End Date Taking? Authorizing Provider  cephALEXin (KEFLEX) 500 MG capsule Take 500 mg by mouth 3 (three) times daily. 07/09/13 07/18/13 Yes Historical Provider, MD  ibuprofen (ADVIL,MOTRIN) 200 MG tablet Take 400 mg by mouth every 6 (six) hours as needed for moderate pain.   Yes Historical Provider, MD  ondansetron (ZOFRAN-ODT) 8 MG disintegrating tablet Take 8 mg by mouth every 8 (eight) hours as needed for nausea or vomiting.   Yes Historical Provider, MD  oxyCODONE-acetaminophen (PERCOCET/ROXICET) 5-325 MG per tablet Take 1-2 tablets by mouth every 4 (four) hours as needed for severe pain.   Yes Historical Provider, MD   BP 153/95  Pulse 98  Temp(Src) 98.1 F (36.7 C) (Oral)  Resp 18  SpO2 98%  Physical Exam  Nursing note and vitals reviewed. Constitutional: He is oriented to person, place, and time. He appears well-developed and well-nourished. No distress.  Uncomfortable appearing  HENT:  Head: Normocephalic and atraumatic.  Mouth/Throat: Oropharynx is clear and moist.  Eyes: Conjunctivae and EOM are normal. Pupils are equal, round, and reactive to light.  Neck: Normal range of motion. Neck supple.  Cardiovascular: Normal rate, regular rhythm and normal heart sounds.   Pulmonary/Chest: Effort normal and breath sounds normal. No respiratory distress. He has no wheezes.  Abdominal: Soft. Bowel sounds are normal. There is no tenderness. There is CVA tenderness (left). There is no guarding.  Musculoskeletal: Normal range of motion.  Neurological: He is alert and oriented to person, place, and time.   Skin: Skin is warm and dry. He is not diaphoretic.  Psychiatric: He has a normal mood and affect.    ED Course  Procedures (including critical care time) Labs Review Labs Reviewed  BASIC METABOLIC PANEL - Abnormal; Notable for the following:    Glucose, Bld 116 (*)    GFR calc non Af Amer 84 (*)    All other components within normal limits  URINALYSIS, ROUTINE W REFLEX MICROSCOPIC - Abnormal; Notable for the following:    APPearance CLOUDY (*)    Hgb urine dipstick LARGE (*)    Protein, ur 30 (*)    Leukocytes, UA SMALL (*)    All other components within normal limits  URINE MICROSCOPIC-ADD ON - Abnormal; Notable for the following:    Bacteria, UA MANY (*)    All other components within normal limits  URINE CULTURE  CBC WITH DIFFERENTIAL    Imaging Review No results found.   EKG Interpretation None      MDM   Final diagnoses:  Left flank pain   33 year old male status post left ureteral stone retrieval.  Stent was removed Monday 6/29, doing well until this afternoon.  On exam, pt is afebrile and non-toxic appearing but he does appear uncomfortable.  CT scan and renal u/s from last week were reviewed-- pt had solitary left ureteral stone which has since been removed.  Suspect sx due to renal colic/ureteral spasm.  Will tx with pain meds.  Will obtain basic labs to assess renal function.  U/a to check for recurrent UTI.  toradol and morphine ordered.  Labs reassuring, renal function is preserved. UA with many bacteria, patient is currently on keflex. Will send for urine culture. Patient states pain completely resolved even before pain medication was given and has not recurred. Patient is lying comfortably in bed, NAD, VS stable.  States he feels if pain were better controlled at home, he would not have to come to the ED.  Pt is currently taking percocet 5-325, will increase dose to 10-325 and have him FU closely with Dr. Brunilda PayorNesi.  Discussed plan with patient, he/she acknowledged  understanding and agreed with plan of care.  Return precautions given for new or worsening symptoms.  Garlon HatchetLisa M Sanders, PA-C 07/13/13 2142

## 2013-07-14 LAB — URINE CULTURE
CULTURE: NO GROWTH
Colony Count: NO GROWTH

## 2016-01-19 IMAGING — CT CT ABD-PELV W/O CM
2 of 4 series · 17 of 46 positions shown, 19 images · non-contrast
Comparison: None.

CLINICAL DATA: Left flank pain

EXAM:
CT ABDOMEN AND PELVIS WITHOUT CONTRAST
TECHNIQUE: Multidetector CT imaging of the abdomen and pelvis was performed
following the standard protocol without IV contrast.

[Series 2: under 200# stone no prev · axial · 0.86mm/px · z∈[+736,+1200]mm · 14 of 103 slices shown, 16 images]
[im 5/103  soft-tissue]
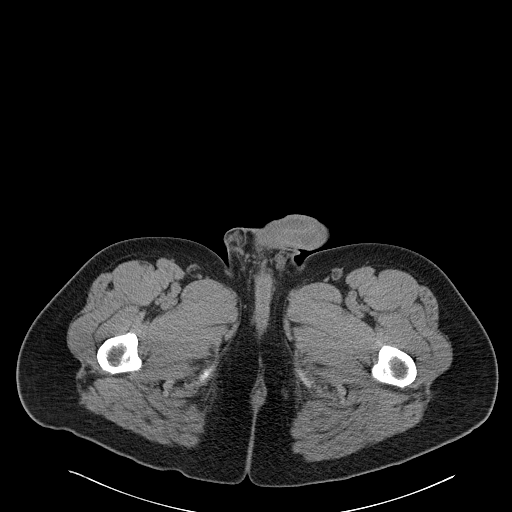
[im 5/103  bone]
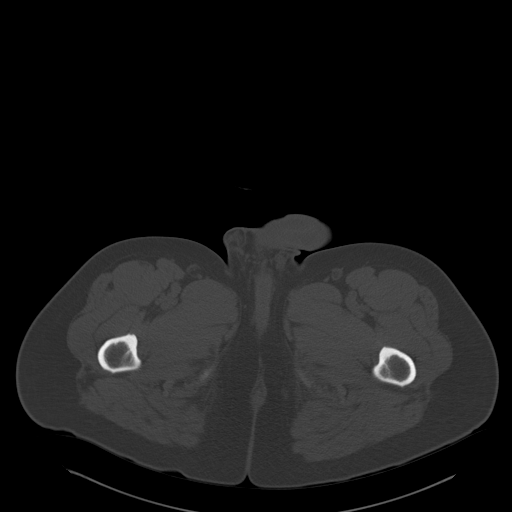
[im 13/103  soft-tissue]
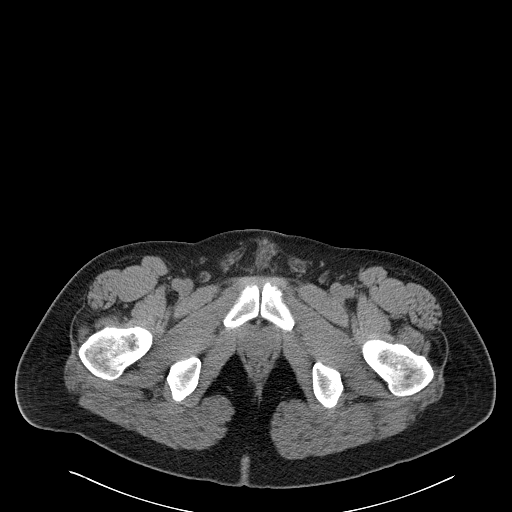
[im 21/103  soft-tissue]
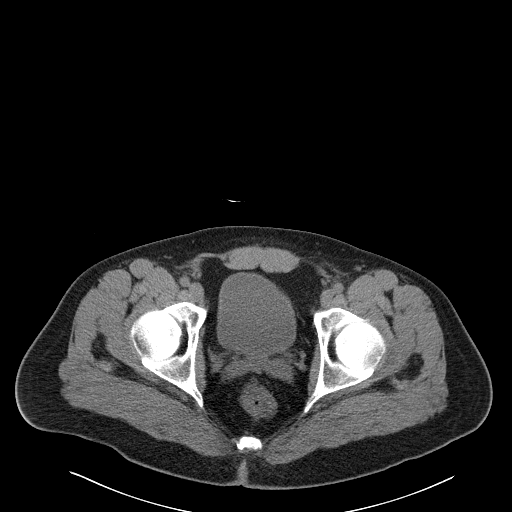
[im 29/103  soft-tissue]
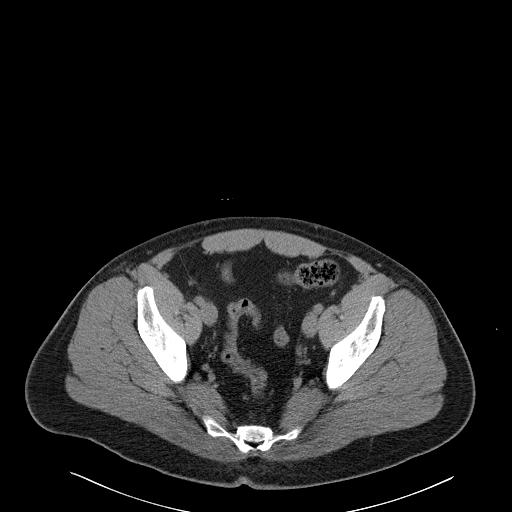
[im 33/103  soft-tissue]
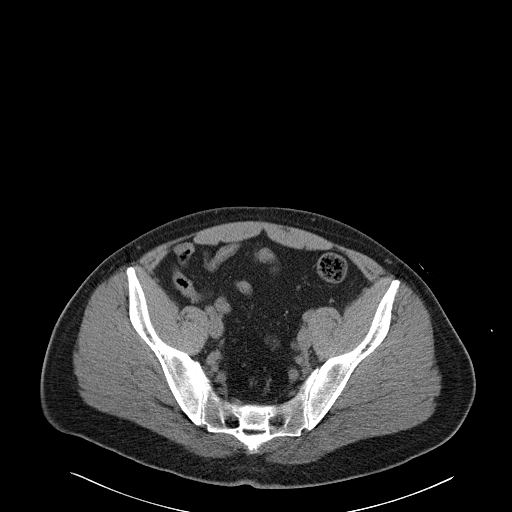
[im 41/103  soft-tissue]
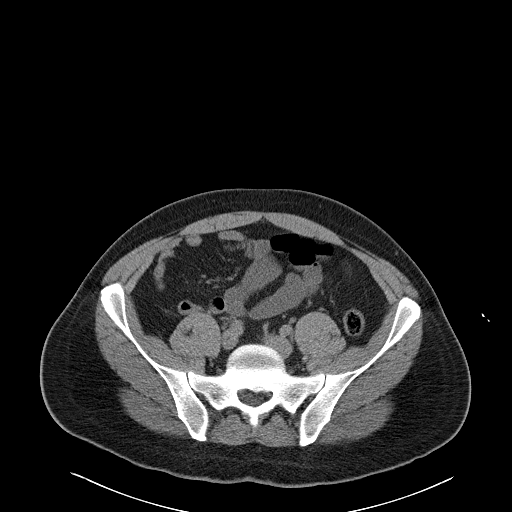
[im 49/103  soft-tissue]
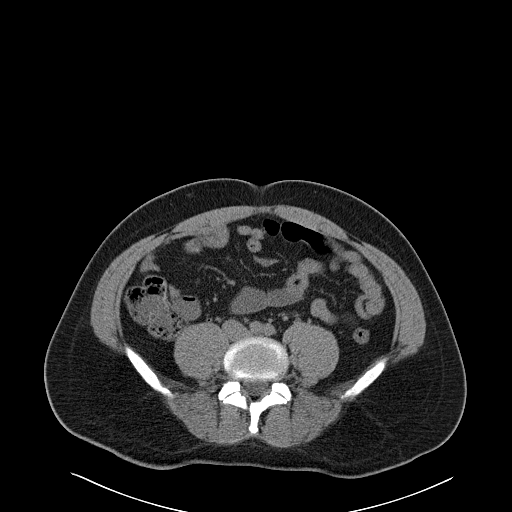
[im 54/103  soft-tissue]
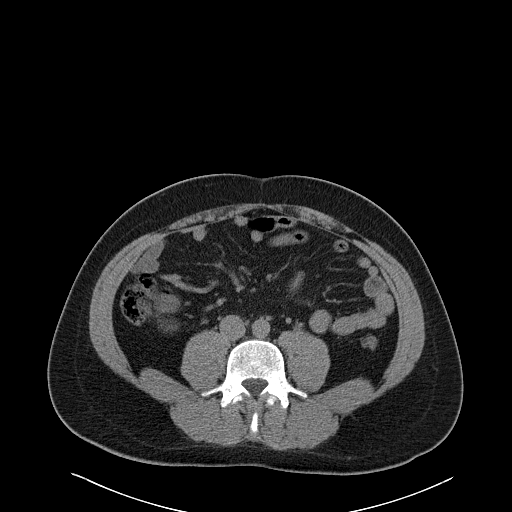
[im 62/103  soft-tissue]
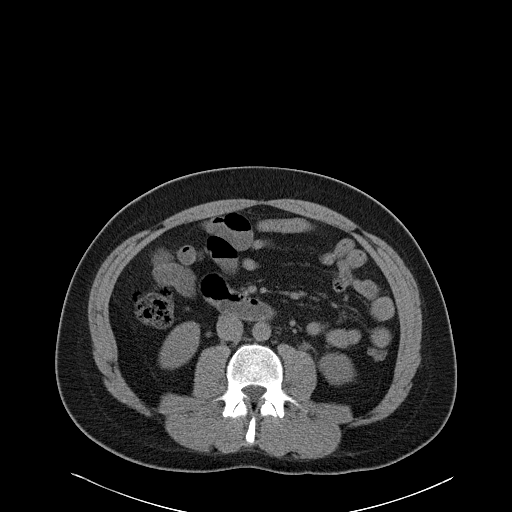
[im 62/103  bone]
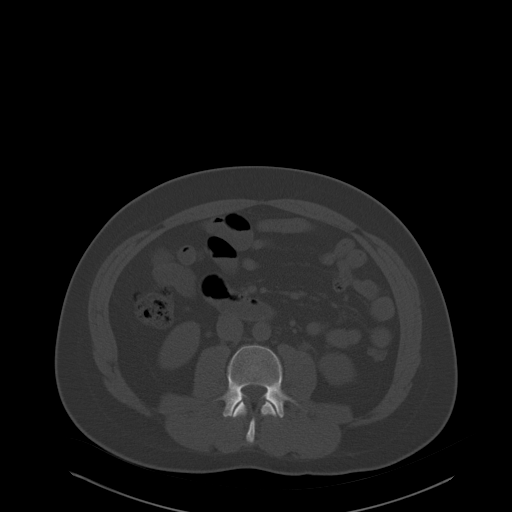
[im 70/103  soft-tissue]
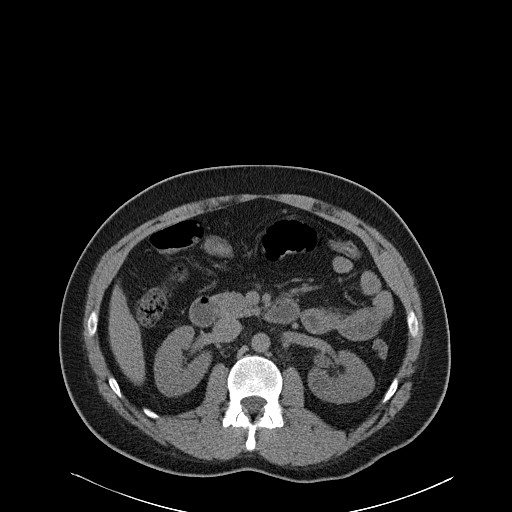
[im 78/103  soft-tissue]
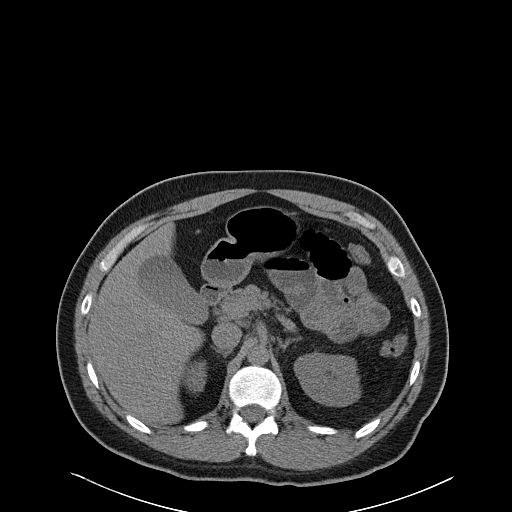
[im 82/103  soft-tissue]
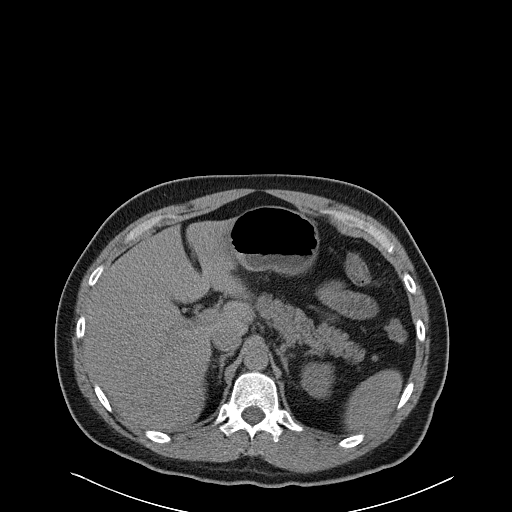
[im 90/103  soft-tissue]
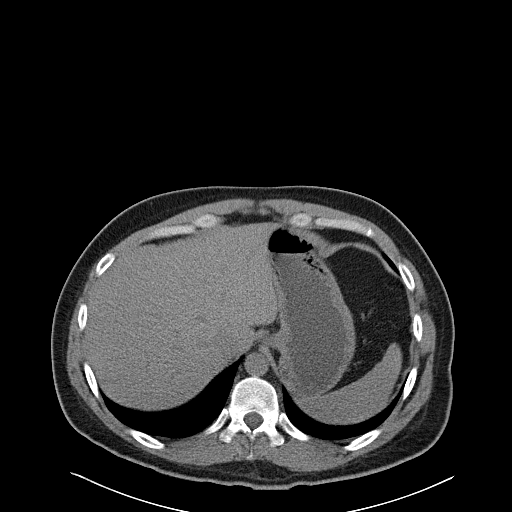
[im 98/103  soft-tissue]
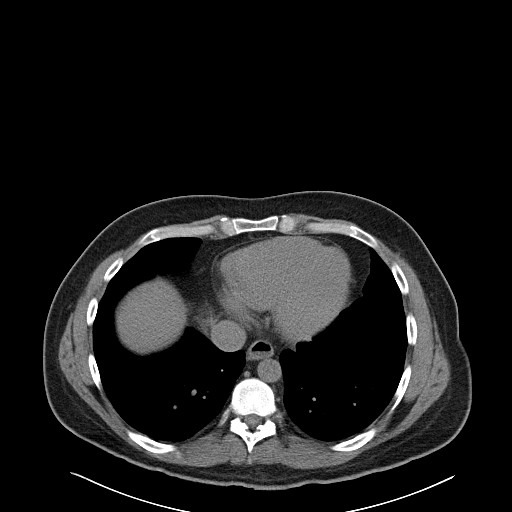

[Series 602: <mpr thick range> · coronal · 1.00mm/px · 3 of 88 slices shown]
[im 30/88  soft-tissue]
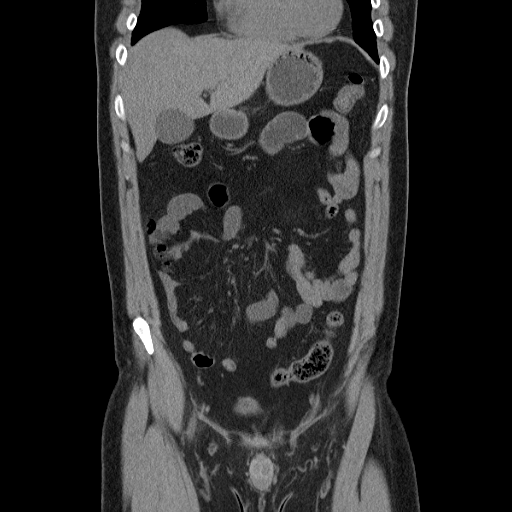
[im 39/88  soft-tissue]
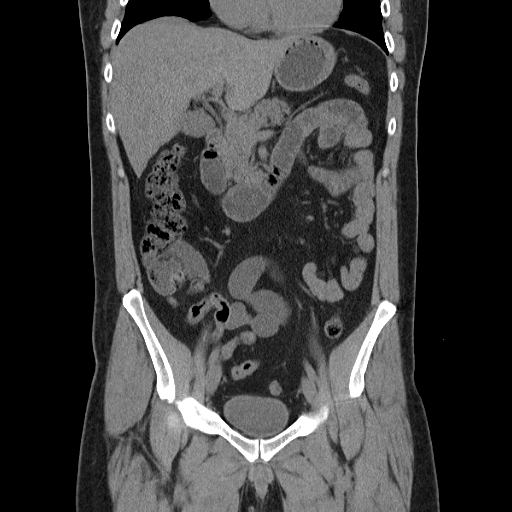
[im 49/88  soft-tissue]
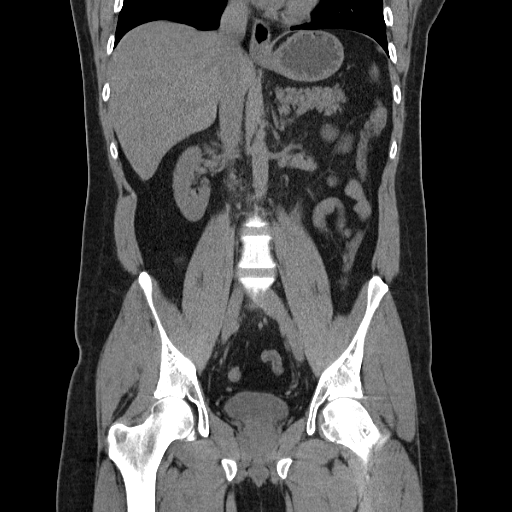

[17 of 46 positions shown; findings below may reference images not displayed]

FINDINGS: Lung bases are unremarkable. Small amount of fluid noted in distal
esophagus probable from gastroesophageal reflux. Clinical
correlation is necessary. Unenhanced liver shows no biliary ductal
dilatation. No calcified gallstones are noted within gallbladder.
Unenhanced pancreas, spleen and adrenal glands are unremarkable.
There is mild left hydronephrosis. Minimal left hydroureter. There
is mild left periureteral stranding. No renal calcifications are
identified.

In axial image 79 there is 4 mm calcified obstructive calculus in
distal left ureter about 2.5 cm from left UVJ. No calcified calculi
are noted within urinary bladder. Prostate gland and seminal
vesicles are unremarkable.

No small bowel obstruction. No pericecal inflammation. Normal
appendix. No ascites or free air. No adenopathy. No inguinal
adenopathy. No destructive bony lesions are noted within pelvis.

Sagittal images of the spine are unremarkable. No destructive bony
lesions are noted.
IMPRESSION: 1. There is mild left hydronephrosis and minimal left hydroureter.
Mild left periureteral stranding.
2. There is 4 mm calcified obstructive calculus in distal left
ureter about 2.5 cm from left UVJ.
3. Normal appendix.  No pericecal inflammation.
4. No calcified calculi are noted within urinary bladder.
5. No small bowel obstruction.

## 2016-01-25 IMAGING — US US RENAL
1 series · 14 of 25 positions shown · non-contrast
Comparison: None.

CLINICAL DATA: Shortness of breath, left nephrostomy

EXAM:
RENAL/URINARY TRACT ULTRASOUND COMPLETE

[Series 1: us renal · 0.22mm/px · 14 of 37 slices shown]
[im 1/37]
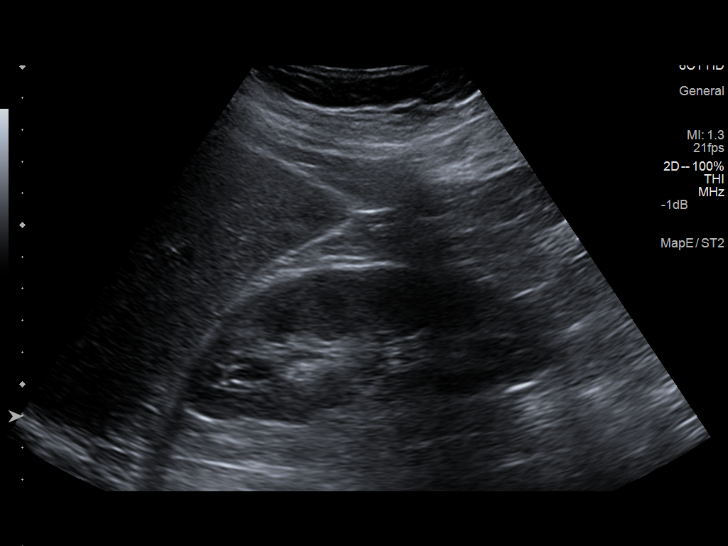
[im 4/37]
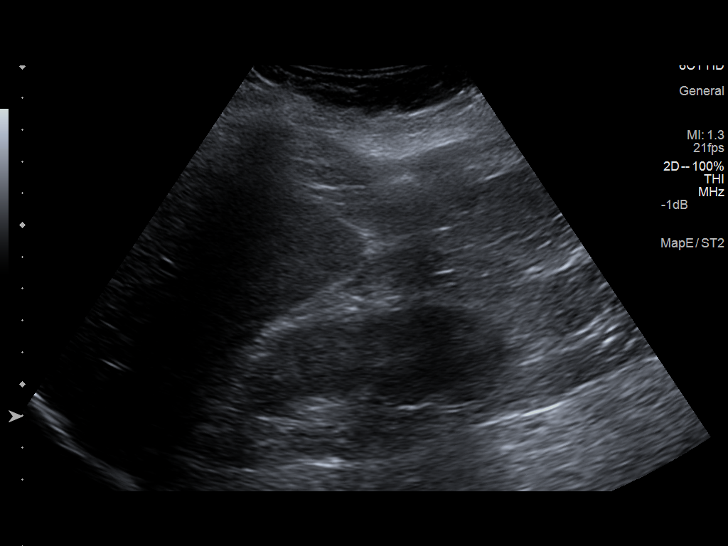
[im 7/37]
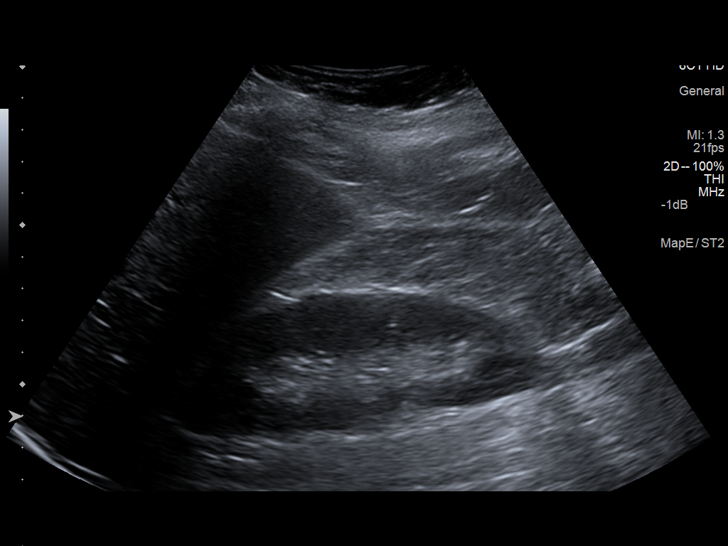
[im 10/37]
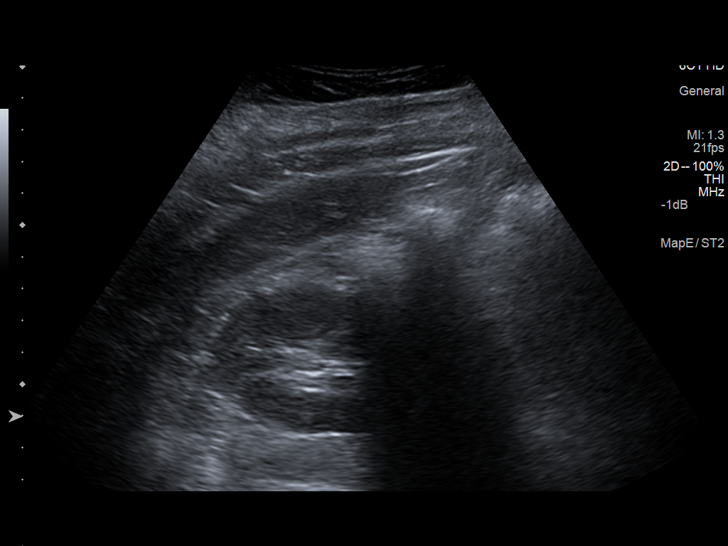
[im 13/37]
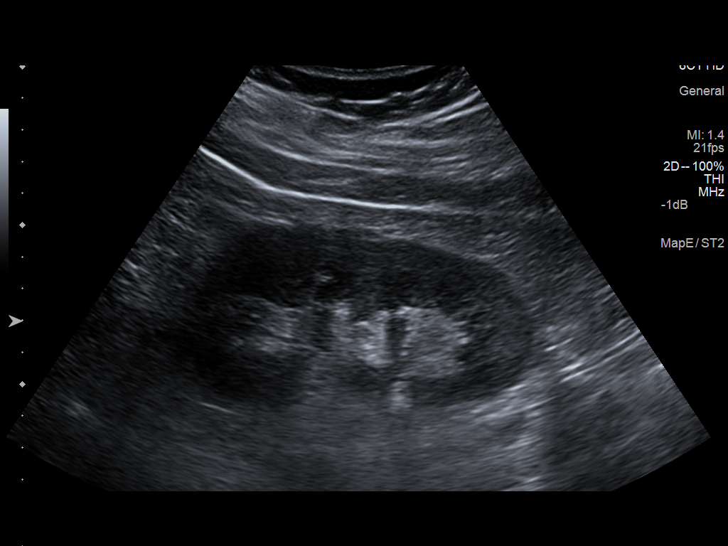
[im 14/37]
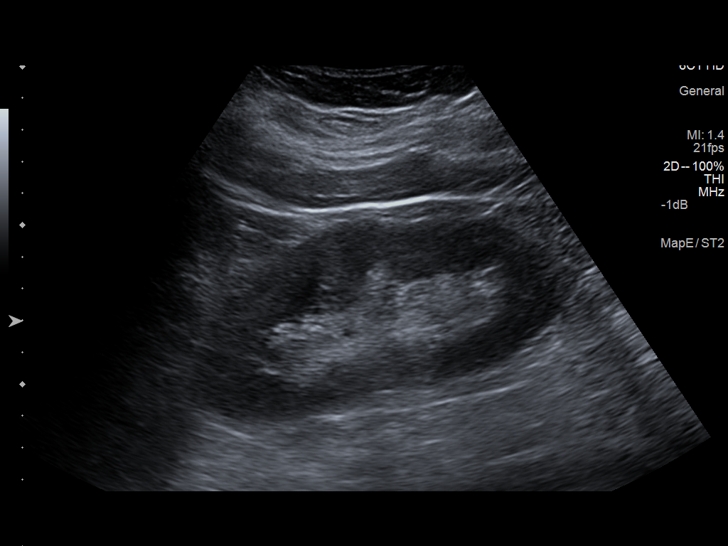
[im 17/37]
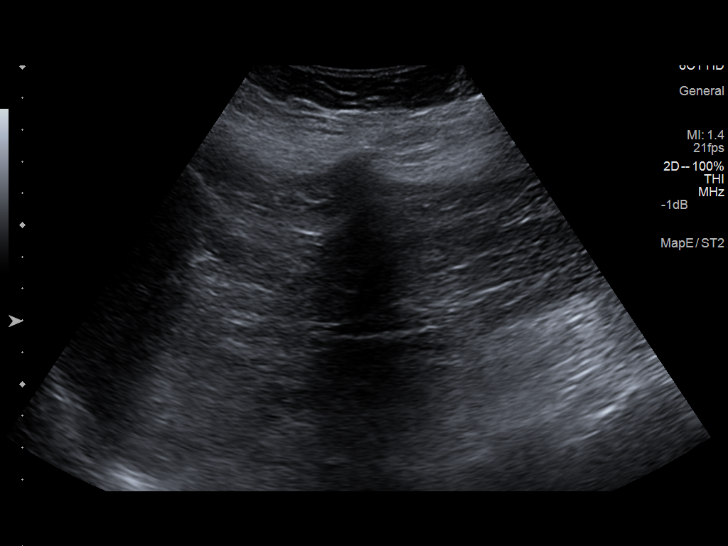
[im 20/37]
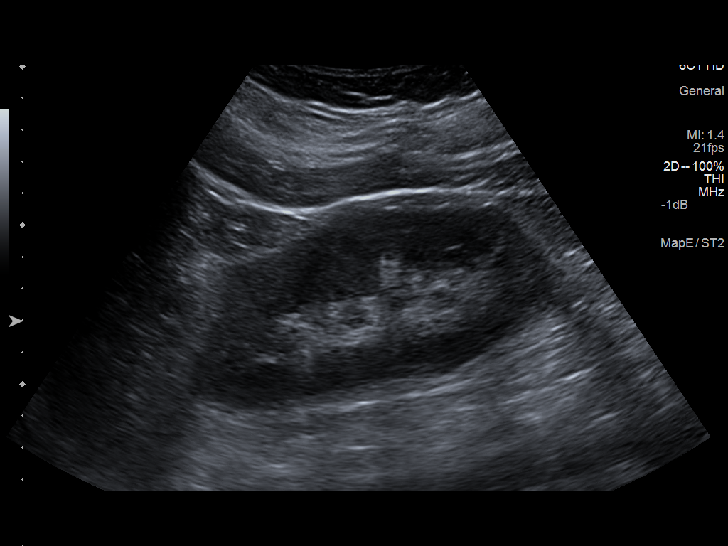
[im 23/37]
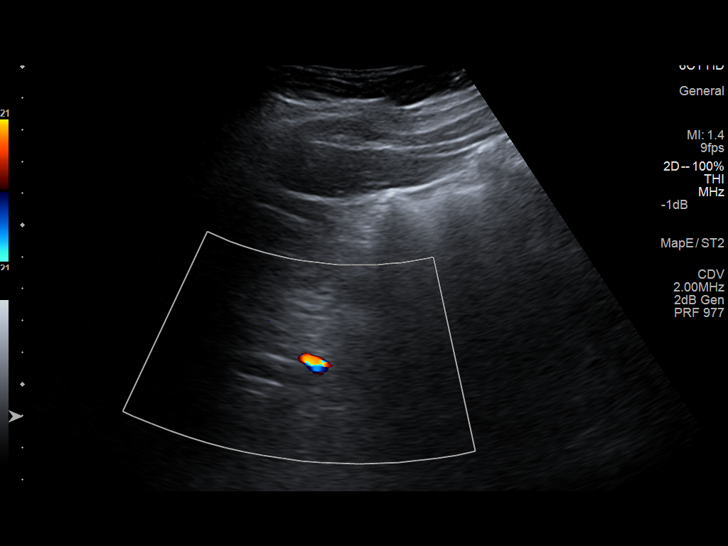
[im 25/37]
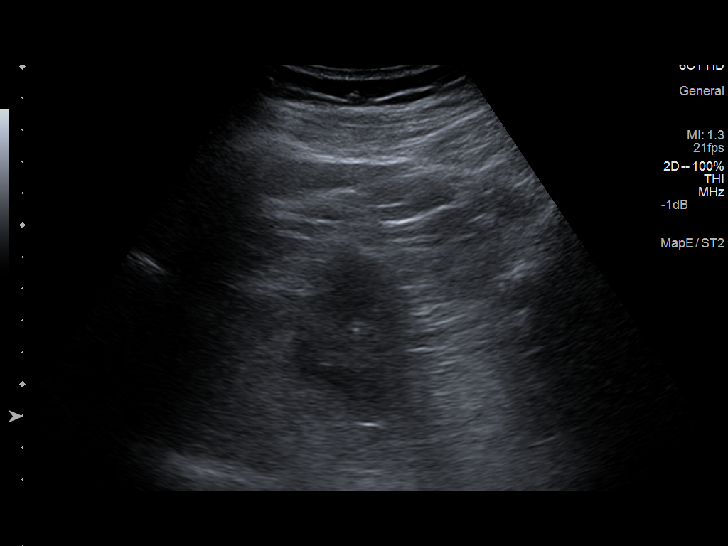
[im 28/37]
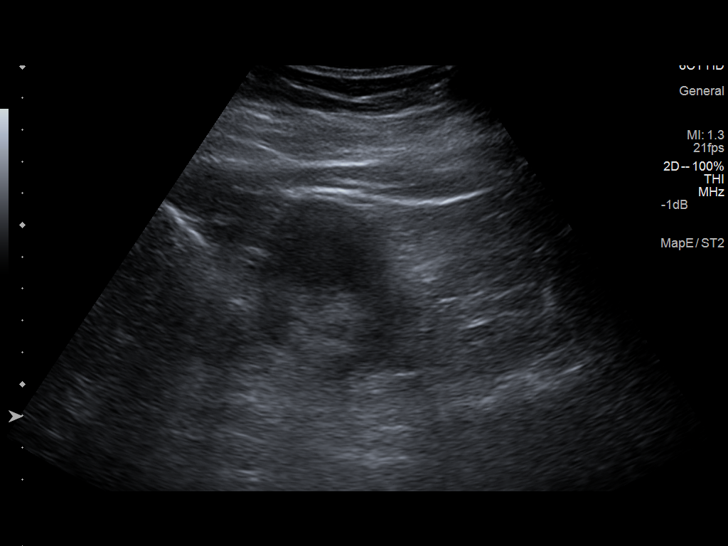
[im 31/37]
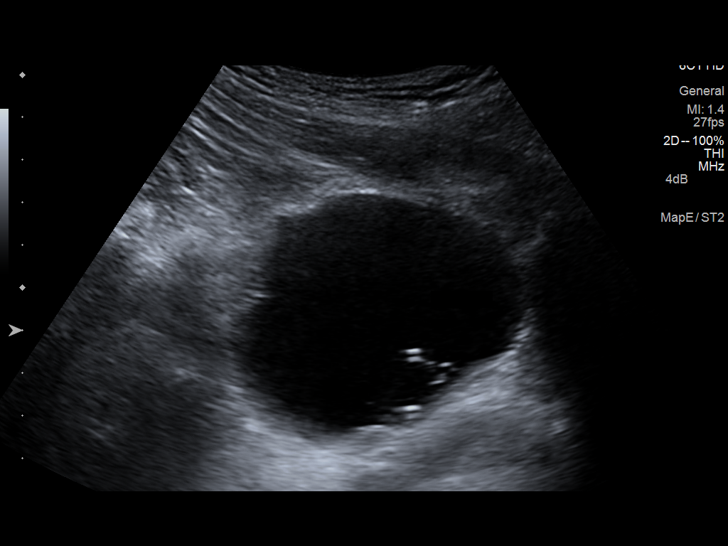
[im 34/37]
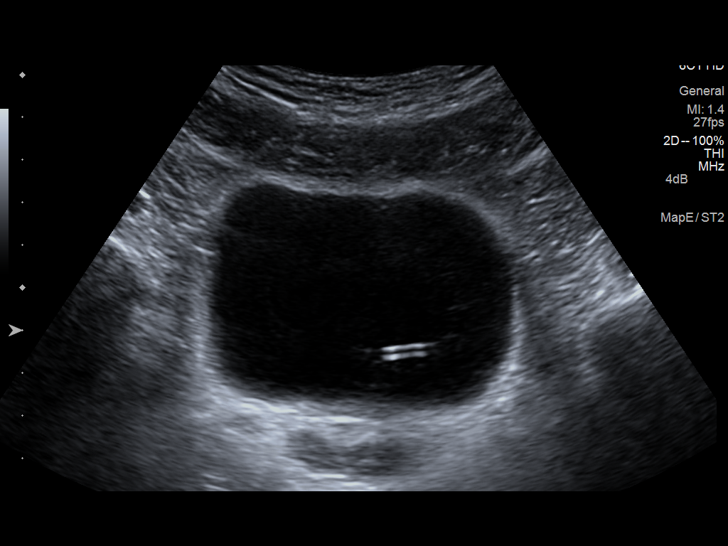
[im 37/37]
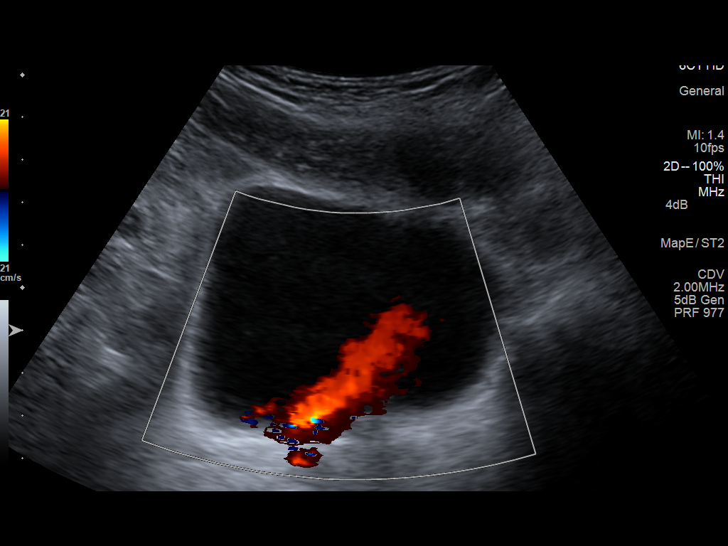

[14 of 25 positions shown; findings below may reference images not displayed]

FINDINGS: Right Kidney:

Length: 11.7 cm. Echogenicity within normal limits. No mass or
hydronephrosis visualized.

Left Kidney:

Length: 12.7 cm. Left nephroureteral stent. Echogenicity within
normal limits. No mass or hydronephrosis visualized.

Bladder:

Appears normal for degree of bladder distention.
IMPRESSION: 1. No obstructive uropathy.
2. Left nephroureteral stent in satisfactory position.
